# Patient Record
Sex: Female | Born: 1965 | Race: White | Hispanic: No | Marital: Married | Smoking: Never smoker
Health system: Southern US, Community
[De-identification: ages and names within clinical notes are randomized; demographics above are authoritative.]

## PROBLEM LIST (undated history)

## (undated) DIAGNOSIS — G43909 Migraine, unspecified, not intractable, without status migrainosus: Secondary | ICD-10-CM

## (undated) DIAGNOSIS — E7800 Pure hypercholesterolemia, unspecified: Secondary | ICD-10-CM

## (undated) DIAGNOSIS — E039 Hypothyroidism, unspecified: Secondary | ICD-10-CM

## (undated) DIAGNOSIS — F419 Anxiety disorder, unspecified: Secondary | ICD-10-CM

## (undated) HISTORY — PX: OTHER SURGICAL HISTORY: SHX169

## (undated) HISTORY — PX: ABDOMINAL HYSTERECTOMY: SHX81

---

## 1998-07-30 ENCOUNTER — Other Ambulatory Visit: Admission: RE | Admit: 1998-07-30 | Discharge: 1998-07-30 | Payer: Self-pay | Admitting: Obstetrics and Gynecology

## 1999-10-04 ENCOUNTER — Other Ambulatory Visit: Admission: RE | Admit: 1999-10-04 | Discharge: 1999-10-04 | Payer: Self-pay | Admitting: *Deleted

## 2000-08-15 ENCOUNTER — Encounter (INDEPENDENT_AMBULATORY_CARE_PROVIDER_SITE_OTHER): Payer: Self-pay | Admitting: Specialist

## 2000-08-15 ENCOUNTER — Ambulatory Visit (HOSPITAL_BASED_OUTPATIENT_CLINIC_OR_DEPARTMENT_OTHER): Admission: RE | Admit: 2000-08-15 | Discharge: 2000-08-15 | Payer: Self-pay | Admitting: General Surgery

## 2000-11-06 ENCOUNTER — Other Ambulatory Visit: Admission: RE | Admit: 2000-11-06 | Discharge: 2000-11-06 | Payer: Self-pay | Admitting: Obstetrics and Gynecology

## 2001-03-22 ENCOUNTER — Encounter (INDEPENDENT_AMBULATORY_CARE_PROVIDER_SITE_OTHER): Payer: Self-pay | Admitting: *Deleted

## 2001-03-22 ENCOUNTER — Ambulatory Visit (HOSPITAL_BASED_OUTPATIENT_CLINIC_OR_DEPARTMENT_OTHER): Admission: RE | Admit: 2001-03-22 | Discharge: 2001-03-22 | Payer: Self-pay | Admitting: General Surgery

## 2001-12-20 ENCOUNTER — Other Ambulatory Visit: Admission: RE | Admit: 2001-12-20 | Discharge: 2001-12-20 | Payer: Self-pay | Admitting: Obstetrics and Gynecology

## 2002-01-31 ENCOUNTER — Encounter: Payer: Self-pay | Admitting: Family Medicine

## 2002-01-31 ENCOUNTER — Encounter (INDEPENDENT_AMBULATORY_CARE_PROVIDER_SITE_OTHER): Payer: Self-pay | Admitting: *Deleted

## 2002-01-31 ENCOUNTER — Ambulatory Visit (HOSPITAL_COMMUNITY): Admission: RE | Admit: 2002-01-31 | Discharge: 2002-01-31 | Payer: Self-pay | Admitting: Family Medicine

## 2002-02-12 ENCOUNTER — Ambulatory Visit (HOSPITAL_COMMUNITY): Admission: RE | Admit: 2002-02-12 | Discharge: 2002-02-12 | Payer: Self-pay | Admitting: Surgery

## 2002-02-12 ENCOUNTER — Encounter: Payer: Self-pay | Admitting: Surgery

## 2002-03-21 ENCOUNTER — Encounter: Payer: Self-pay | Admitting: Surgery

## 2002-03-26 ENCOUNTER — Inpatient Hospital Stay (HOSPITAL_COMMUNITY): Admission: RE | Admit: 2002-03-26 | Discharge: 2002-03-27 | Payer: Self-pay | Admitting: Surgery

## 2002-03-26 ENCOUNTER — Encounter (INDEPENDENT_AMBULATORY_CARE_PROVIDER_SITE_OTHER): Payer: Self-pay | Admitting: *Deleted

## 2003-02-11 ENCOUNTER — Other Ambulatory Visit: Admission: RE | Admit: 2003-02-11 | Discharge: 2003-02-11 | Payer: Self-pay | Admitting: Obstetrics and Gynecology

## 2004-05-06 ENCOUNTER — Other Ambulatory Visit: Admission: RE | Admit: 2004-05-06 | Discharge: 2004-05-06 | Payer: Self-pay | Admitting: Obstetrics and Gynecology

## 2004-08-11 ENCOUNTER — Encounter (INDEPENDENT_AMBULATORY_CARE_PROVIDER_SITE_OTHER): Payer: Self-pay | Admitting: *Deleted

## 2004-08-11 ENCOUNTER — Ambulatory Visit (HOSPITAL_COMMUNITY): Admission: RE | Admit: 2004-08-11 | Discharge: 2004-08-11 | Payer: Self-pay | Admitting: Obstetrics and Gynecology

## 2004-09-08 ENCOUNTER — Other Ambulatory Visit: Admission: RE | Admit: 2004-09-08 | Discharge: 2004-09-08 | Payer: Self-pay | Admitting: Obstetrics and Gynecology

## 2005-05-24 ENCOUNTER — Observation Stay (HOSPITAL_COMMUNITY): Admission: RE | Admit: 2005-05-24 | Discharge: 2005-05-25 | Payer: Self-pay | Admitting: Obstetrics and Gynecology

## 2005-05-24 ENCOUNTER — Encounter (INDEPENDENT_AMBULATORY_CARE_PROVIDER_SITE_OTHER): Payer: Self-pay | Admitting: Specialist

## 2006-09-11 ENCOUNTER — Encounter: Admission: RE | Admit: 2006-09-11 | Discharge: 2006-09-11 | Payer: Self-pay | Admitting: Family Medicine

## 2008-07-16 ENCOUNTER — Emergency Department (HOSPITAL_COMMUNITY): Admission: EM | Admit: 2008-07-16 | Discharge: 2008-07-16 | Payer: Self-pay | Admitting: Emergency Medicine

## 2010-10-18 LAB — COMPREHENSIVE METABOLIC PANEL
ALT: 14 U/L (ref 0–35)
AST: 23 U/L (ref 0–37)
Albumin: 4.1 g/dL (ref 3.5–5.2)
Alkaline Phosphatase: 49 U/L (ref 39–117)
BUN: 15 mg/dL (ref 6–23)
CO2: 22 mEq/L (ref 19–32)
Calcium: 8.5 mg/dL (ref 8.4–10.5)
Chloride: 105 mEq/L (ref 96–112)
Creatinine, Ser: 0.92 mg/dL (ref 0.4–1.2)
GFR calc Af Amer: >60 mL/min (ref >60)
GFR calc non Af Amer: >60 mL/min (ref >60)
Glucose, Bld: 146 mg/dL — ABNORMAL HIGH (ref 70–99)
Potassium: 3 mEq/L — ABNORMAL LOW (ref 3.5–5.1)
Sodium: 138 mEq/L (ref 135–145)
Total Bilirubin: 0.9 mg/dL (ref 0.3–1.2)
Total Protein: 6.9 g/dL (ref 6.0–8.3)

## 2010-10-18 LAB — LIPASE, BLOOD: Lipase: 24 U/L (ref 11–59)

## 2010-11-04 ENCOUNTER — Other Ambulatory Visit: Payer: Self-pay | Admitting: Dermatology

## 2010-11-19 NOTE — Op Note (Signed)
Kimberly Zimmerman, BARICH             ACCOUNT NO.:  [id number]   MEDICAL RECORD NO.:  [id number]          PATIENT TYPE:  AMB   LOCATION:  SDC                           FACILITY:  [facility]   PHYSICIAN:  Duke Salvia. Marcelle Overlie, M.D.DATE OF BIRTH:  1966/06/17   DATE OF PROCEDURE:  08/11/2004  DATE OF DISCHARGE:                                 OPERATIVE REPORT   PREOPERATIVE DIAGNOSIS:  Abnormal uterine bleeding.   POSTOPERATIVE DIAGNOSIS:  Abnormal uterine bleeding.   PROCEDURE:  D&C, Novasure EMA.   SURGEON:  Duke Salvia. Marcelle Overlie, M.D.   ANESTHESIA:  Paracervical block plus general.   SPECIMENS:  Endometrial curettings.   ESTIMATED BLOOD LOSS:  Less than 5 mL.   COMPLICATIONS:  None.   DESCRIPTION OF PROCEDURE:  The patient was taken to the operating room and  after adequate level of general mask anesthesia was obtained with the  patient's legs in stirrups, the perineum and vagina were prepped and draped  with Betadine.  The bladder was drained.  EUA carried out with uterus  midposition and normal size.  Adnexa negative.  Cervix grasped with a  tenaculum.  Paracervical block was instituted primarily to help with  postoperative pain, 5 to 7 mL of 1% Nesacaine and 1% Xylocaine on either  side at 3 and 9 o'clock after negative aspiration was placed.  The uterus  was then sounded to 8.5 cm with a cervical length of 3, cavity length was  therefore 5.5.  After dilatation, D&C was carried out mainly to obtain a  specimen for endometrial biopsy.  She had previously had a sonohysterogram  which showed a normal cavity.  Minimal tissue was removed.  The Novasure was  then placed per protocol at a cavity depth of 5.5, width assessed at 2.5,  past the CO2 test and ablation was carried out per protocol.  A power of 76  with a time of 90 seconds without complications.  The instrument was  removed.  She had no bleeding and went to the recovery room in good  condition.  She did receive Toradol IV.      RMH/MEDQ  D:  08/11/2004  T:  08/11/2004  Job:  [id number]

## 2010-11-19 NOTE — Discharge Summary (Signed)
Kimberly Zimmerman, Kimberly Zimmerman             ACCOUNT NO.:  [id number]   MEDICAL RECORD NO.:  [id number]          PATIENT TYPE:  OBV   LOCATION:  9302                          [facility]ACILITY:  [facility]   PHYSICIAN:  Duke Salvia. Kimberly Zimmerman, Kimberly ZimmermanDATE OF BIRTH:  July 04, 1966   DATE OF ADMISSION:  05/24/2005  DATE OF DISCHARGE:  05/25/2005                                 DISCHARGE SUMMARY   DISCHARGE DIAGNOSES:  1.  Menorrhagia.  2.  Laparoscopically assisted vaginal hysterectomy this admission.   HISTORY OF PRESENT ILLNESS:  For summary of the admission history and  physical examination please see admission H and P for details.  Briefly,  this is a 45 year old G4, P2 whose husband has had a vasectomy.  Earlier  this year she underwent a endometrial ablation but has continued to have  bothersome bleeding and presents now for definitive hysterectomy.   HOSPITAL COURSE:  On May 24, 2005 under general anesthesia the patient  underwent a laparoscopically assisted vaginal hysterectomy.  The first  postoperative day she was afebrile, ambulating without difficulty.  She had  established normal bowel and urinary function.  CBC is pending at the time  of discharge.  Her abdominal examination is unremarkable and she was ready  for discharge at that point.   LABORATORY DATA:  Blood type is A positive, antibody screen negative.  Urinalysis was negative.  Hemoglobin on admission 13.4. The remainder of her  CBC was normal.  UPT negative.   DISPOSITION:  Patient is discharged on Mepergan Fortis one p.o. q.4-6h. PRN  pain or Motrin 800 mg p.o. q.8h. PRN pain.  She will return to the office in  one week.  She is advised to report any incisional redness or drainage,  increased pain or bleeding or fever over 101.  Other symptoms to report  would include urinary complaints, persistent nausea or vomiting.  She was  given specific instructions regarding diet, sex, exercise.   CONDITION ON DISCHARGE:  Good.   ACTIVITY:   Graded increased.      Kimberly Zimmerman, M.D.  Electronically Signed     RMH/MEDQ  D:  05/25/2005  T:  05/25/2005  Job:  [id number]

## 2010-11-19 NOTE — H&P (Signed)
Kimberly Zimmerman, Kimberly Zimmerman             ACCOUNT NO.:  [id number]   MEDICAL RECORD NO.:  [id number]          PATIENT TYPE:  AMB   LOCATION:  SDC                           [facility]   PHYSICIAN:  Duke Salvia. Marcelle Overlie, M.D.DATE OF BIRTH:  10/16/1965   DATE OF ADMISSION:  05/24/2005  DATE OF DISCHARGE:                                HISTORY & PHYSICAL   CHIEF COMPLAINT:  Abnormal bleeding and dysmenorrhea.   HISTORY OF PRESENT ILLNESS:  A 45 year old, G4, P2, whose husband has had a  vasectomy.  Recently because of bleeding and cramping, she had undergone HSG  in 2003 that showed a normal cavity with no other abnormalities.   She underwent D&C with NovaSure EMA August 31, 2004.  Pathology then  showed endometrial polyp with some complex hyperplasia without atypia.  She  has had continued problems since the ablation and presents now for  definitive hysterectomy.  This procedure including risk of bleeding,  infection, adjacent organ injury, risks related to transfusion along with  her expected recovery time all reviewed, all of which she understands and  accepts.   PAST MEDICAL HISTORY:   ALLERGIES:  1.  ERYTHROMYCIN.  2.  SEPTRA.  3.  CODEINE.   CURRENT MEDICATIONS:  Levothroid.   OBSTETRICAL HISTORY:  She has had 2 vaginal deliveries at term without  complication.   FAMILY HISTORY:  Significant for a father with skin cancer, grandfather with  lung cancer and grandmother also with lung cancer.  She is a nonsmoker.   PHYSICAL EXAMINATION:  VITAL SIGNS:  Temp 98.2, blood pressure 128/62.  HEENT:  Unremarkable.  NECK:  Supple without masses.  LUNGS:  Clear.  CARDIOVASCULAR:  Regular rate and rhythm without murmurs, rubs, or gallops.  BREASTS:  Without masses.  ABDOMEN:  Soft, flat, nontender.  PELVIC EXAM:  Normal external genitalia, vagina, cervix, uterus midposition,  normal size, adnexa negative.  EXTREMITIES/NEUROLOGIC:  Unremarkable.   IMPRESSION:  Continued cramping  and bleeding post ablation, probable  adenomyosis.   PLAN:  LAVH.  The procedure and risks were reviewed as above.      Richard M. Marcelle Overlie, M.D.  Electronically Signed     RMH/MEDQ  D:  05/18/2005  T:  05/18/2005  Job:  [id number]

## 2010-11-19 NOTE — Op Note (Signed)
[place]  Patient:    SHARLETTE, JANSMA                      MRN: [id number] Proc. Date: 08/15/00 Adm. Date:  [id number] Attending:  Janalyn Rouse                           Operative Report  PREOPERATIVE DIAGNOSIS:  Hypertrophic breast tissue in the tail of Spence.  POSTOPERATIVE DIAGNOSIS:  Hypertrophic breast tissue in the tail of Spence.  OPERATION:  Excision of same.  SURGEON:  Rose Phi. Maple Hudson, M.D.  ANESTHESIA:  General.  DESCRIPTION OF PROCEDURE:  The hypertrophic and tender breast tissue that extended over the lateral margin of the pectoralis major muscle on the right side was outlined with a marking pencil.  After suitable general anesthesia was induced, a curved incision was made overlying it and then, using the cautery, I excised this area of tissue, which just looked like fibrotic breast tissue.  Hemostasis obtained with the cautery.  Subcuticular closure of 4-0 Monocryl and Steri-Strips carried out.  Dressing applied.  Patient transferred to recovery room in satisfactory condition having tolerated the procedure well. DD:  08/15/00 TD:  08/15/00 Job: [id number]

## 2010-11-19 NOTE — Op Note (Signed)
[place]  Patient:    Kimberly Zimmerman, Kimberly Zimmerman Visit Number: [id number] MRN: [id number]          Service Type: DSU Location: [facility] Attending Physician:  Janalyn Rouse Dictated by:   Rose Phi. Maple Hudson, M.D. Proc. Date: 03/22/01 Admit Date:  03/22/2001                             Operative Report  PREOPERATIVE DIAGNOSIS:  Prominent breast tissue in the axilla with pain and tenderness.  POSTOPERATIVE DIAGNOSIS:  Prominent breast tissue in the axilla with pain and tenderness.  OPERATION PERFORMED:  Excision of right breast mass.  SURGEON:  Rose Phi. Maple Hudson, M.D.  ANESTHESIA:  General.  DESCRIPTION OF PROCEDURE:  The patient was placed on the operating table with the right arm extended on the arm board and the axilla prepped and draped in the usual fashion.  Her previous axillary incision was then incised after infiltrating it with 0.25% Marcaine.  I then excised all of this axillary fat and breast tissue.  One bleeder had to be ligated.  We had good hemostasis.  I then injected the 0.25% Marcaine and closed the incision with 3-0 Vicryl and a subcuticular 4-0 Monocryl and Steri-Strips.  Dressing applied.  The patient was transferred to the recovery room in satisfactory condition having tolerated the procedure well. Dictated by:   Rose Phi. Maple Hudson, M.D. Attending Physician:  Janalyn Rouse DD:  03/22/01 TD:  03/22/01 Job: [id number]

## 2010-11-19 NOTE — H&P (Signed)
Kimberly Zimmerman, Kimberly Zimmerman             ACCOUNT NO.:  [id number]   MEDICAL RECORD NO.:  [id number]          PATIENT TYPE:  AMB   LOCATION:  SDC                           [facility]   PHYSICIAN:  Duke Salvia. Marcelle Overlie, M.D.DATE OF BIRTH:  [birth year]   DATE OF ADMISSION:  DATE OF DISCHARGE:                                HISTORY & PHYSICAL   DATE OF SCHEDULED SURGERY:  August 11, 2004   CHIEF COMPLAINT:  Menorrhagia.   HISTORY OF PRESENT ILLNESS:  A 45 year old G4 P2.  This patient's husband  has had a vasectomy.  Her children are ages 22 and 79.  Due to persistent  problems with menorrhagia she underwent SHG in our office September 2003  that showed a normal endometrial cavity, no other evidence of polyps or  fibroids.  She has failed control with OCPs and presents now for endometrial  ablation.  This procedure including risk of bleeding, infection, other  complications such as perforation that may require additional follow-up or  surgery discussed with her which she understands and accepts.  The  amenorrhea/hypomenorrhea rates for NovaSure endometrial ablation reviewed  with her also.   PAST MEDICAL HISTORY:  Allergies:  SEPTRA, ERYTHROMYCIN, and CODEINE.   OBSTETRICAL HISTORY:  Two vaginal deliveries at term.  She has had two other  SABs.   FAMILY HISTORY:  Significant for her father with skin cancer and mother with  thyroid dysfunction; otherwise, unremarkable.   CURRENT MEDICATIONS:  Levothroid.   PHYSICAL EXAMINATION:  VITAL SIGNS:  Temperature 98.2, blood pressure  104/78.  HEENT:  Unremarkable.  NECK:  Supple without masses.  LUNGS:  Clear.  CARDIOVASCULAR:  Regular rate and rhythm without murmurs, rubs, gallops  noted.  BREASTS:  Without masses.  ABDOMEN:  Soft, flat, nontender.  PELVIC:  Normal external genitalia, vagina and cervix clear.  Uterus was mid  position, normal size.  Adnexa negative.  EXTREMITIES AND NEUROLOGIC:  Unremarkable.   IMPRESSION:   Menorrhagia.   PLAN:  D&C, NovaSure endometrial ablation.  Procedure and risks reviewed as  above.      RMH/MEDQ  D:  08/03/2004  T:  08/03/2004  Job:  [id number]

## 2010-11-19 NOTE — Op Note (Signed)
Kimberly Zimmerman, Kimberly Zimmerman             ACCOUNT NO.:  [id number]   MEDICAL RECORD NO.:  [id number]          PATIENT TYPE:  AMB   LOCATION:  SDC                           [facility]   PHYSICIAN:  Duke Salvia. Marcelle Overlie, M.D.DATE OF BIRTH:  Jan 20, 1966   DATE OF PROCEDURE:  05/24/2005  DATE OF DISCHARGE:                                 OPERATIVE REPORT   PREOPERATIVE DIAGNOSES:  1.  Menorrhagia.  2.  Possible adenomyosis.   POSTOPERATIVE DIAGNOSES:  1.  Menorrhagia.  2.  Possible adenomyosis.   PROCEDURE:  Laparoscopically-assisted vaginal hysterectomy.   SURGEON:  Duke Salvia. Marcelle Overlie, M.D.   ANESTHESIA:  General endotracheal.   ASSISTANT:  Zelphia Cairo, M.D.   SPECIMENS REMOVED:  Uterus.   ESTIMATED BLOOD LOSS:  200 mL.   PROCEDURE AND FINDINGS:  The patient was taken to the operating room.  After  an adequate level of general endotracheal anesthesia was obtained with the  patient's legs in stirrups, the abdomen, perineum and vagina were prepped  and draped in the usual manner for LAVH.  The bladder was drained, EUA  carried out.  Uterus midposition, normal size, mobile.  Adnexa negative.  Hulka tenaculum was positioned, attention directed to the a subumbilical  area, which was infiltrated with 0.5% Marcaine plain.  A small incision was  made and the Veress needle was introduced at difficulty.  Its intra-  abdominal position was verified by pressure and water testing.  After a 2.5  L pneumoperitoneum was then created, laparoscopic trocar sleeve were then  introduced without difficulty.  Three fingerbreadths above the symphysis in  the midline, a 5 mm trocar was inserted under direct visualization.  The  patient then placed in Trendelenburg and the pelvic findings as follows.   The uterus itself was somewhat globular at the fundus, upper limit of normal  size, mobile, right adnexa unremarkable.  The cul-de-sac and anterior space  were free and clear.  There was some filmy  adhesions surrounding a 10% of  the left ovary, which were lysed in an avascular plane, allowing it to be  elevated.  One pigmented area on the surface of the ovary that was pretty  minimal that may have represented endometriosis was coagulated with bipolar  later.  Otherwise, that ovary was normal and was conserved.  The utero-  ovarian pedicle on each side was then coagulated and divided with the Gyrus  PK instrument down to and including the round ligament with excellent  hemostasis.  The vaginal portion of the procedure started at that point.  The legs were extended, a weighted speculum was positioned, the cervix  grasped with a tenaculum.  The cervicovaginal mucosa was incised  circumferentially, posterior colpotomy performed without difficulty.  The  left uterosacral ligament was then coagulated and divided with the Gyrus  handheld PK instrument.  The bladder was then advanced superiorly until the  peritoneum could be identified.  The peritoneum was entered sharply and a  retractor used to gently elevate the bladder out of the field.  In a  sequential manner, the cardinal ligament, uterine vasculature pedicle and  upper  broad ligaments were clamped, coagulated and divided.  The fundus of  the uterus was then delivered posteriorly. The remaining pedicle was  clamped, divided and free tied with 0 Vicryl free tie after the specimen was  removed.  The cuff was then closed from 3 to 9 o'clock with a running locked  2-0 Vicryl suture.  A McCall culdoplasty suture placed from left uterosacral  ligament, picking up the posterior peritoneum across to the opposite  uterosacral ligament, which was tied down for extra posterior support.  Prior to closure sponge, needle and instrument counts were reported as  correct x2.  The mucosa was then closed right-to-left with interrupted 2-0  Monocryl sutures.  Foley catheter positioned draining clear urine.  Attention directed to repeat laparoscopy.  The  pelvis was irrigated and  aspirated within the Nezhat irrigator.  The operative site was noted to be  hemostatic.  Pressure was reduced with excellent hemostasis.  Instruments  were removed and gas allowed to escape.  The defect was closed with 4-0  Dexon subcuticular sutures and Dermabond.  She tolerated this well, went to  the recovery room in good condition.      Richard M. Marcelle Overlie, M.D.  Electronically Signed     RMH/MEDQ  D:  05/24/2005  T:  05/24/2005  Job:  [id number]

## 2010-11-19 NOTE — Op Note (Signed)
NAMEDAVEIGH, Kimberly Zimmerman                         ACCOUNT NO.:  [id number]   MEDICAL RECORD NO.:  [id number]                   PATIENT TYPE:  INP   LOCATION:  5725                                 FACILITY:  [facility]   PHYSICIAN:  Douglas A. Magnus Ivan, M.D.           DATE OF BIRTH:  28-Aug-1965   DATE OF PROCEDURE:  03/26/2002  DATE OF DISCHARGE:  03/27/2002                                 OPERATIVE REPORT   PREOPERATIVE DIAGNOSES:  Multinodular thyroid goiter.   POSTOPERATIVE DIAGNOSES:  Multinodular thyroid goiter.   OPERATION PERFORMED:  Total thyroidectomy.   SURGEON:  Douglas A. Magnus Ivan, M.D.   ASSISTANT:  Rose Phi. Maple Hudson, M.D.   ANESTHESIA:  General endotracheal.   ESTIMATED BLOOD LOSS:  Minimal.   INDICATIONS FOR PROCEDURE:  The patient is a 45 year old female who presents  with a multinodular goiter with ______ left side.  Fine needle aspiration  has confirmed this to be consistent with a thyroid goiter.  She has been  having compressive symptoms on her trachea and the enlarged thyroid is  clearly visible.  She has a family history of her mother and grandmother  needing thyroidectomies for goiter.   DESCRIPTION OF PROCEDURE:  The patient was brought to the operating room and  identified.  She was placed supine on the operating table and general  anesthesia was induced.  Her neck was then prepped and draped in the usual  sterile fashion.  Using a 10 blade a transverse incision was made across the  lower neck in a skin fold.  The incision was then carried down through the  platysma with the electrocautery.  Next, superior and inferior skin flaps  were created with the cautery.  The median raphe was then opened along the  midline with the electrocautery.  The strap muscles were then identified and  dissected free of the top of the thyroid gland.  The muscles were retracted  and not split.  Examination of the left side of the gland revealed a diffuse  thyroid goiter with a very  large lower lobe nodule that appeared free  floating.  Dissection was then carried out staying close to the capsule and  thyroid gland.  The middle vein was identified and clipped proximally and  distally and cut.  Dissection further with Kitner bluntly allowed the gland  to be elevated out of the incision.  The vessels to the upper pole were  easily identified, clipped several times proximally and once distally and  transected.  The superior parathyroid gland was also identified and spared.  Further dissection lower revealed the extremely large lower nodule that  appeared to go into a substernal location.  This was again easily elevated  bluntly and dissected out with both right angle clamp and the  electrocautery.  The recurrent laryngeal nerve on the left side was also  easily identified as well as the inferior parathyroid gland.  Dissection was  continued  and branches off the inferior thyroid artery were identified,  clipped and cut as well.  The gland was then dissected up to the midline.  The separate substernal nodule was completely freed and excised.  This was  sent to pathology.  Next our attention was turned toward the right side of  the gland.  Again the overlying strap muscles were dissected off the gland  with the electrocautery and then blunt dissection was performed with the  Kittner around the lateral aspect of the gland.  Again, the medial vein was  identified and clipped twice proximally and once distally and transected.  Again the superior pole appeared easy to dissect.  Therefore these vessels  were identified and clipped proximally and distally and transected.  Again  the superior parathyroid artery appeared to be identified on the right side  and was spared.  Further dissection revealed large nodules again on the  lower pole of the right gland.  These were again dissected free with the  electrocautery.  Again the recurrent laryngeal nerve was identified on the  right  side as well and spared.  Branches to the inferior thyroid artery on  the right side were also identified and clipped.  The thyroid was then  elevated further and its attachments to the Berry ligaments were taken down  with the electrocautery.  The entire gland was then completely excised and  sent to pathology.  Both sides of the trachea were thoroughly irrigated with  normal saline.  Hemostasis appeared to be achieved.  Again the nerves and  parathyroids were examined and felt to be intact.  A piece of Surgicel was  then placed on each side.  The median raphe was then closed with a running 3-  0 Vicryl suture.  The platysma was then closed with interrupted 4-0 Vicryl  sutures and the skin was closed with running 4-0 Monocryl.  Steri-Strips,  gauze and tape were then applied.  The patient tolerated the procedure well.  All sponge, needle and instrument counts were correct at the end of the  procedure.  The patient was then extubated in the operating room and taken  in stable condition to the recovery room.                                                  Douglas A. Magnus Ivan, M.D.    DAB/MEDQ  D:  03/26/2002  T:  03/26/2002  Job:  [id number]

## 2011-09-06 ENCOUNTER — Other Ambulatory Visit: Payer: Self-pay | Admitting: Obstetrics and Gynecology

## 2011-09-06 DIAGNOSIS — N6312 Unspecified lump in the right breast, upper inner quadrant: Secondary | ICD-10-CM

## 2011-09-14 ENCOUNTER — Ambulatory Visit
Admission: RE | Admit: 2011-09-14 | Discharge: 2011-09-14 | Disposition: A | Discharge status: Home / Self Care | Payer: 59 | Source: Ambulatory Visit | Attending: Obstetrics and Gynecology | Admitting: Obstetrics and Gynecology

## 2011-09-14 DIAGNOSIS — N6312 Unspecified lump in the right breast, upper inner quadrant: Secondary | ICD-10-CM

## 2012-02-24 ENCOUNTER — Other Ambulatory Visit: Payer: Self-pay | Admitting: Obstetrics and Gynecology

## 2012-02-24 DIAGNOSIS — Z1231 Encounter for screening mammogram for malignant neoplasm of breast: Secondary | ICD-10-CM

## 2012-03-15 ENCOUNTER — Ambulatory Visit
Admission: RE | Admit: 2012-03-15 | Discharge: 2012-03-15 | Disposition: A | Discharge status: Home / Self Care | Payer: 59 | Source: Ambulatory Visit | Attending: Obstetrics and Gynecology | Admitting: Obstetrics and Gynecology

## 2012-03-15 DIAGNOSIS — Z1231 Encounter for screening mammogram for malignant neoplasm of breast: Secondary | ICD-10-CM

## 2013-03-01 ENCOUNTER — Other Ambulatory Visit: Payer: Self-pay | Admitting: Obstetrics and Gynecology

## 2013-03-01 DIAGNOSIS — Z1231 Encounter for screening mammogram for malignant neoplasm of breast: Secondary | ICD-10-CM

## 2013-03-01 DIAGNOSIS — Z9889 Other specified postprocedural states: Secondary | ICD-10-CM

## 2013-03-20 ENCOUNTER — Ambulatory Visit
Admission: RE | Admit: 2013-03-20 | Discharge: 2013-03-20 | Disposition: A | Discharge status: Home / Self Care | Payer: 59 | Source: Ambulatory Visit | Attending: Obstetrics and Gynecology | Admitting: Obstetrics and Gynecology

## 2013-03-20 DIAGNOSIS — Z1231 Encounter for screening mammogram for malignant neoplasm of breast: Secondary | ICD-10-CM

## 2013-03-20 DIAGNOSIS — Z9889 Other specified postprocedural states: Secondary | ICD-10-CM

## 2014-03-26 ENCOUNTER — Other Ambulatory Visit: Payer: Self-pay

## 2014-03-26 DIAGNOSIS — Z1231 Encounter for screening mammogram for malignant neoplasm of breast: Secondary | ICD-10-CM

## 2014-04-04 ENCOUNTER — Ambulatory Visit
Admission: RE | Admit: 2014-04-04 | Discharge: 2014-04-04 | Disposition: A | Discharge status: Home / Self Care | Payer: 59 | Source: Ambulatory Visit

## 2014-04-04 DIAGNOSIS — Z1231 Encounter for screening mammogram for malignant neoplasm of breast: Secondary | ICD-10-CM

## 2014-04-07 ENCOUNTER — Other Ambulatory Visit: Payer: Self-pay | Admitting: Obstetrics and Gynecology

## 2014-04-07 DIAGNOSIS — R928 Other abnormal and inconclusive findings on diagnostic imaging of breast: Secondary | ICD-10-CM

## 2014-04-10 ENCOUNTER — Ambulatory Visit
Admission: RE | Admit: 2014-04-10 | Discharge: 2014-04-10 | Disposition: A | Discharge status: Home / Self Care | Payer: 59 | Source: Ambulatory Visit | Attending: Obstetrics and Gynecology | Admitting: Obstetrics and Gynecology

## 2014-04-10 ENCOUNTER — Other Ambulatory Visit: Payer: Self-pay | Admitting: Neurosurgery

## 2014-04-10 DIAGNOSIS — R928 Other abnormal and inconclusive findings on diagnostic imaging of breast: Secondary | ICD-10-CM

## 2014-04-10 DIAGNOSIS — M5416 Radiculopathy, lumbar region: Secondary | ICD-10-CM

## 2014-04-21 ENCOUNTER — Ambulatory Visit
Admission: RE | Admit: 2014-04-21 | Discharge: 2014-04-21 | Disposition: A | Discharge status: Home / Self Care | Payer: 59 | Source: Ambulatory Visit | Attending: Neurosurgery | Admitting: Neurosurgery

## 2014-04-21 DIAGNOSIS — M5416 Radiculopathy, lumbar region: Secondary | ICD-10-CM

## 2014-07-08 ENCOUNTER — Other Ambulatory Visit: Payer: Self-pay | Admitting: Obstetrics and Gynecology

## 2014-07-09 LAB — CYTOLOGY - PAP

## 2014-09-09 ENCOUNTER — Other Ambulatory Visit: Payer: Self-pay | Admitting: Obstetrics and Gynecology

## 2014-09-09 DIAGNOSIS — N6310 Unspecified lump in the right breast, unspecified quadrant: Secondary | ICD-10-CM

## 2014-09-19 ENCOUNTER — Other Ambulatory Visit: Payer: Self-pay | Admitting: Family Medicine

## 2014-09-19 ENCOUNTER — Ambulatory Visit
Admission: RE | Admit: 2014-09-19 | Discharge: 2014-09-19 | Disposition: A | Discharge status: Home / Self Care | Payer: Self-pay | Source: Ambulatory Visit | Attending: Family Medicine | Admitting: Family Medicine

## 2014-09-19 DIAGNOSIS — R059 Cough, unspecified: Secondary | ICD-10-CM

## 2014-09-19 DIAGNOSIS — R05 Cough: Secondary | ICD-10-CM

## 2014-10-06 ENCOUNTER — Ambulatory Visit
Admission: RE | Admit: 2014-10-06 | Discharge: 2014-10-06 | Disposition: A | Discharge status: Home / Self Care | Payer: 59 | Source: Ambulatory Visit | Attending: Obstetrics and Gynecology | Admitting: Obstetrics and Gynecology

## 2014-10-06 DIAGNOSIS — N6310 Unspecified lump in the right breast, unspecified quadrant: Secondary | ICD-10-CM

## 2015-03-10 ENCOUNTER — Other Ambulatory Visit: Payer: Self-pay | Admitting: Obstetrics and Gynecology

## 2015-03-10 DIAGNOSIS — N6310 Unspecified lump in the right breast, unspecified quadrant: Secondary | ICD-10-CM

## 2015-04-06 ENCOUNTER — Ambulatory Visit
Admission: RE | Admit: 2015-04-06 | Discharge: 2015-04-06 | Disposition: A | Discharge status: Home / Self Care | Payer: 59 | Source: Ambulatory Visit | Attending: Obstetrics and Gynecology | Admitting: Obstetrics and Gynecology

## 2015-04-06 ENCOUNTER — Other Ambulatory Visit: Payer: Self-pay | Admitting: Obstetrics and Gynecology

## 2015-04-06 DIAGNOSIS — N6320 Unspecified lump in the left breast, unspecified quadrant: Secondary | ICD-10-CM

## 2015-04-06 DIAGNOSIS — N6310 Unspecified lump in the right breast, unspecified quadrant: Secondary | ICD-10-CM

## 2015-07-21 ENCOUNTER — Ambulatory Visit (INDEPENDENT_AMBULATORY_CARE_PROVIDER_SITE_OTHER): Payer: 59 | Admitting: Podiatry

## 2015-07-21 ENCOUNTER — Encounter: Payer: Self-pay | Admitting: Podiatry

## 2015-07-21 ENCOUNTER — Ambulatory Visit (INDEPENDENT_AMBULATORY_CARE_PROVIDER_SITE_OTHER): Payer: 59

## 2015-07-21 ENCOUNTER — Telehealth: Payer: Self-pay | Admitting: *Deleted

## 2015-07-21 DIAGNOSIS — M2011 Hallux valgus (acquired), right foot: Secondary | ICD-10-CM | POA: Diagnosis not present

## 2015-07-21 NOTE — Patient Instructions (Signed)
Pre-Operative Instructions  Congratulations, you have decided to take an important step to improving your quality of life.  You can be assured that the doctors of [facility] will be with you every step of the way.  1. Plan to be at the surgery center/hospital at least 1 (one) hour prior to your scheduled time unless otherwise directed by the surgical center/hospital staff.  You must have a responsible adult accompany you, remain during the surgery and drive you home.  Make sure you have directions to the surgical center/hospital and know how to get there on time. 2. For hospital based surgery you will need to obtain a history and physical form from your family physician within 1 month prior to the date of surgery- we will give you a form for you primary physician.  3. We make every effort to accommodate the date you request for surgery.  There are however, times where surgery dates or times have to be moved.  We will contact you as soon as possible if a change in schedule is required.   4. No Aspirin/Ibuprofen for one week before surgery.  If you are on aspirin, any non-steroidal anti-inflammatory medications (Mobic, Aleve, Ibuprofen) you should stop taking it 7 days prior to your surgery.  You make take Tylenol  For pain prior to surgery.  5. Medications- If you are taking daily heart and blood pressure medications, seizure, reflux, allergy, asthma, anxiety, pain or diabetes medications, make sure the surgery center/hospital is aware before the day of surgery so they may notify you which medications to take or avoid the day of surgery. 6. No food or drink after midnight the night before surgery unless directed otherwise by surgical center/hospital staff. 7. No alcoholic beverages 24 hours prior to surgery.  No smoking 24 hours prior to or 24 hours after surgery. 8. Wear loose pants or shorts- loose enough to fit over bandages, boots, and casts. 9. No slip on shoes, sneakers are best. 10. Bring  your boot with you to the surgery center/hospital.  Also bring crutches or a walker if your physician has prescribed it for you.  If you do not have this equipment, it will be provided for you after surgery. 11. If you have not been contracted by the surgery center/hospital by the day before your surgery, call to confirm the date and time of your surgery. 12. Leave-time from work may vary depending on the type of surgery you have.  Appropriate arrangements should be made prior to surgery with your employer. 13. Prescriptions will be provided immediately following surgery by your doctor.  Have these filled as soon as possible after surgery and take the medication as directed. 14. Remove nail polish on the operative foot. 15. Wash the night before surgery.  The night before surgery wash the foot and leg well with the antibacterial soap provided and water paying special attention to beneath the toenails and in between the toes.  Rinse thoroughly with water and dry well with a towel.  Perform this wash unless told not to do so by your physician.  Enclosed: 1 Ice pack (please put in freezer the night before surgery)   1 Hibiclens skin cleaner   Pre-op Instructions  If you have any questions regarding the instructions, do not hesitate to call our office.  [place]: [address] [place], [place] [address] [phone]  [place]: [address] [place], [place] [address] [phone]  [place]: [address]  [place], [place] [address] [phone]  Dr. Richard   Tuchman DPM, Dr. Norman Regal DPM Dr. Richard Sikora DPM, Dr. M. Todd Hyatt DPM, Dr. Kathryn Egerton DPM 

## 2015-07-21 NOTE — Progress Notes (Signed)
   Subjective:    Patient ID: Kimberly Zimmerman, female    DOB: 27-May-1966, 50 y.o.   MRN: [id number]  HPI: She presents today with a chief complaint of a painful bunion deformity of the right foot times many years she states it has become most painful over the past 3 months particularly with certain shoe gear. She states she's tried different shoe gear mesh tried nothing to alleviate the symptoms. She is likely continue to be athletic and active however this is limiting her activity level.    Review of Systems  Skin: Positive for color change.       Objective:   Physical Exam: 50 year old female vital signs stable alert and oriented 3. Pulses are strongly palpable. Neurologic sensorium is intact per Semmes-Weinstein monofilament. Deep tendon reflexes are intact bilateral muscle strength +5 over 5 dorsiflexion plantar flexors and inverters and everters all intrinsic musculature is intact. Orthopedic evaluation of his drains all joints distal to the angle full range of motion without crepitation. Cutaneous evaluation demonstrates supple well-hydrated cutis no erythema edema cellulitis drainage or odor. Hallux abductovalgus deformity is noted on orthopedic evaluation with limitation on range of motion of the first metatarsophalangeal joint right foot. Mild erythema overlying the first metatarsophalangeal joint and hypertrophic medial condyle with painful palpation of the medial dorsal cutaneous nerve right foot. Radiographs taken today 3 views right foot do demonstrate an increase in the first intermetatarsal angle and the normal value consistent with hallux abductovalgus deformity.        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  Plan: We discussed the etiology pathology conservative versus surgical therapies. At this point we recommended hallux abductovalgus repair consisting of an [facility] bunion repair with screw fixation right foot. We went over the  consent form today line by line number by number giving her ample time to ask questions she saw fit regarding this procedure. I answered them to the best of my ability in layman's terms. She understood this was amenable to an outside occupation the consent form and I will follow-up with her in the near future. We did discuss the possible complications which may include but are not limited to postop pain bleeding swelling infection recurrence need for further surgery inability to heal loss of digit loss of limb loss of life. Discussed the possibility of blood clots and pulmonary emboli. She was dispensed a cam walker today and I will follow-up with her in the near future.

## 2015-07-22 NOTE — Telephone Encounter (Signed)
"  I left you a message.  I also spoke to [place] this morning.  She transferred me to you.  She just wanted me to let you know I am canceling surgery for right now.  I will schedule it at a later date.  Thank you."  I'm returning your call.  "I spoke to [place] and I decided I'm going to hold off on surgery right now.  I need to work some things out with my job and other things.  I will call you later to schedule.  I'll return the boot."  Give [place] a call when you would like to schedule it.

## 2015-07-22 NOTE — Telephone Encounter (Signed)
Ok

## 2015-07-22 NOTE — Telephone Encounter (Signed)
"  I was just in a little bit ago for a consultation for surgery.  I just want check and see if I decide to wait awhile if I should bring the boot back so I won't be charged for it right now.  I need to discuss it with family.  It may be a while before I have it."

## 2015-08-24 ENCOUNTER — Other Ambulatory Visit: Payer: Self-pay | Admitting: Obstetrics and Gynecology

## 2015-08-24 DIAGNOSIS — N6489 Other specified disorders of breast: Secondary | ICD-10-CM

## 2015-10-05 ENCOUNTER — Ambulatory Visit
Admission: RE | Admit: 2015-10-05 | Discharge: 2015-10-05 | Disposition: A | Discharge status: Home / Self Care | Payer: 59 | Source: Ambulatory Visit | Attending: Obstetrics and Gynecology | Admitting: Obstetrics and Gynecology

## 2015-10-05 DIAGNOSIS — N6489 Other specified disorders of breast: Secondary | ICD-10-CM

## 2016-03-03 ENCOUNTER — Other Ambulatory Visit: Payer: Self-pay | Admitting: Obstetrics and Gynecology

## 2016-03-03 DIAGNOSIS — Z1231 Encounter for screening mammogram for malignant neoplasm of breast: Secondary | ICD-10-CM

## 2016-04-08 ENCOUNTER — Ambulatory Visit
Admission: RE | Admit: 2016-04-08 | Discharge: 2016-04-08 | Disposition: A | Discharge status: Home / Self Care | Payer: 59 | Source: Ambulatory Visit | Attending: Obstetrics and Gynecology | Admitting: Obstetrics and Gynecology

## 2016-04-08 DIAGNOSIS — Z1231 Encounter for screening mammogram for malignant neoplasm of breast: Secondary | ICD-10-CM

## 2016-05-25 ENCOUNTER — Emergency Department (HOSPITAL_COMMUNITY)
Admission: EM | Admit: 2016-05-25 | Discharge: 2016-05-25 | Disposition: A | Discharge status: Home / Self Care | Payer: 59 | Attending: Emergency Medicine | Admitting: Emergency Medicine

## 2016-05-25 ENCOUNTER — Encounter (HOSPITAL_COMMUNITY): Payer: Self-pay | Admitting: Emergency Medicine

## 2016-05-25 DIAGNOSIS — Y999 Unspecified external cause status: Secondary | ICD-10-CM | POA: Diagnosis not present

## 2016-05-25 DIAGNOSIS — M545 Low back pain: Secondary | ICD-10-CM | POA: Diagnosis present

## 2016-05-25 DIAGNOSIS — Y929 Unspecified place or not applicable: Secondary | ICD-10-CM | POA: Insufficient documentation

## 2016-05-25 DIAGNOSIS — Y939 Activity, unspecified: Secondary | ICD-10-CM | POA: Insufficient documentation

## 2016-05-25 DIAGNOSIS — X500XXA Overexertion from strenuous movement or load, initial encounter: Secondary | ICD-10-CM | POA: Diagnosis not present

## 2016-05-25 DIAGNOSIS — M5442 Lumbago with sciatica, left side: Secondary | ICD-10-CM | POA: Diagnosis not present

## 2016-05-25 HISTORY — DX: Pure hypercholesterolemia, unspecified: E78.00

## 2016-05-25 HISTORY — DX: Anxiety disorder, unspecified: F41.9

## 2016-05-25 MED ORDER — HYDROMORPHONE HCL 2 MG/ML IJ SOLN
1.0000 mg | Freq: Once | INTRAMUSCULAR | Status: AC
  Administered 2016-05-25: 1 mg via INTRAMUSCULAR
  Filled 2016-05-25: qty 1

## 2016-05-25 MED ORDER — PREDNISONE 10 MG (21) PO TBPK: 10.0000 mg | ORAL_TABLET | Freq: Every day | ORAL | 0 refills | Status: DC

## 2016-05-25 MED ORDER — HYDROCODONE-ACETAMINOPHEN 5-325 MG PO TABS: 1.0000 | ORAL_TABLET | ORAL | 0 refills | Status: DC | PRN

## 2016-05-25 MED ORDER — NAPROXEN 375 MG PO TABS: 375.0000 mg | ORAL_TABLET | Freq: Two times a day (BID) | ORAL | 0 refills | Status: AC | PRN

## 2016-05-25 MED ORDER — KETOROLAC TROMETHAMINE 60 MG/2ML IM SOLN
60.0000 mg | Freq: Once | INTRAMUSCULAR | Status: AC
  Administered 2016-05-25: 60 mg via INTRAMUSCULAR
  Filled 2016-05-25: qty 2

## 2016-05-25 NOTE — ED Provider Notes (Signed)
[facility] DEPT Provider Note   CSN: [id number] Arrival date & time: 05/25/16  0536     History   Chief Complaint Chief Complaint  Patient presents with  . Back Pain    HPI Kimberly Zimmerman is a 50 y.o. female.  HPI 50 year old female with history of left-sided lumbar radiculopathy here with acute onset of left lower back pain. The patient states she bent over and lifted her backpack up 2 days ago. She experienced an immediate onset of severe left paraspinal lower back pain. The pain radiates down her left leg. Since then, the pain has been persistent and has not improved. She was prescribed Flexeril without significant improvement. The pain is made worse with any movement, particularly first thing in the mornings and then improves throughout the day. Denies any associated fevers or chills. Denies any midline pain. Denies any trauma. No lower extremity weakness or numbness. No loss of bowel or bladder function.  Past Medical History:  Diagnosis Date  . Anxiety   . Hypercholesteremia     There are no active problems to display for this patient.   Past Surgical History:  Procedure Laterality Date  . ABDOMINAL HYSTERECTOMY    . Arm surgery    . Thyroidectomy      OB History    No data available       Home Medications    Prior to Admission medications   Medication Sig Start Date End Date Taking? Authorizing Provider  albuterol (PROVENTIL HFA;VENTOLIN HFA) 108 (90 Base) MCG/ACT inhaler Inhale 1-2 puffs into the lungs every 6 (six) hours as needed for wheezing or shortness of breath.   Yes Historical Provider, MD  ALPRAZolam (XANAX) 0.25 MG tablet Take 0.25 mg by mouth 3 (three) times daily as needed for anxiety.   Yes Historical Provider, MD  atorvastatin (LIPITOR) 10 MG tablet Take 10 mg by mouth every evening.  06/24/15  Yes Historical Provider, MD  cyclobenzaprine (FLEXERIL) 10 MG tablet Take 10 mg by mouth 3 (three) times daily as needed for muscle spasms.   Yes  Historical Provider, MD  escitalopram (LEXAPRO) 10 MG tablet Take 2.5-5 mg by mouth See admin instructions. Take 1/4 tablet every morning and take 1/2 tablet every evening 06/24/15  Yes Historical Provider, MD  ibuprofen (ADVIL,MOTRIN) 200 MG tablet Take 400 mg by mouth every 6 (six) hours as needed for mild pain.   Yes Historical Provider, MD  naproxen sodium (ANAPROX) 220 MG tablet Take 220 mg by mouth 2 (two) times daily as needed (for pain).   Yes Historical Provider, MD  promethazine-dextromethorphan (PROMETHAZINE-DM) 6.25-15 MG/5ML syrup TAKE 5MLS BY MOUTH EVERY 6 HOURS AS NEEDED 07/10/15  Yes Historical Provider, MD  SYNTHROID 100 MCG tablet Take 100 mcg by mouth daily before breakfast.  05/22/15  Yes Historical Provider, MD  HYDROcodone-acetaminophen (NORCO/VICODIN) 5-325 MG tablet Take 1-2 tablets by mouth every 4 (four) hours as needed for severe pain. 05/25/16   Duffy Bruce, MD  naproxen (NAPROSYN) 375 MG tablet Take 1 tablet (375 mg total) by mouth 2 (two) times daily as needed for moderate pain. 05/25/16 06/01/16  Duffy Bruce, MD  predniSONE (STERAPRED UNI-PAK 21 TAB) 10 MG (21) TBPK tablet Take 1 tablet (10 mg total) by mouth daily. Take 6 tabs by mouth daily  for 2 days, then 5 tabs for 2 days, then 4 tabs for 2 days, then 3 tabs for 2 days, 2 tabs for 2 days, then 1 tab by mouth daily for 2 days 05/25/16  Duffy Bruce, MD    Family History No family history on file.  Social History Social History  Substance Use Topics  . Smoking status: Never Smoker  . Smokeless tobacco: Never Used  . Alcohol use Yes     Allergies   Codeine; Erythromycin; Septra [sulfamethoxazole-trimethoprim]; and Topamax [topiramate]   Review of Systems Review of Systems  Constitutional: Negative for chills and fever.  HENT: Negative for congestion, rhinorrhea and sore throat.   Eyes: Negative for visual disturbance.  Respiratory: Negative for cough, shortness of breath and wheezing.     Cardiovascular: Negative for chest pain and leg swelling.  Gastrointestinal: Negative for abdominal pain, diarrhea, nausea and vomiting.  Genitourinary: Negative for dysuria, flank pain, vaginal bleeding and vaginal discharge.  Musculoskeletal: Positive for back pain. Negative for neck pain.  Skin: Negative for rash.  Allergic/Immunologic: Negative for immunocompromised state.  Neurological: Negative for syncope and headaches.  Hematological: Does not bruise/bleed easily.  All other systems reviewed and are negative.    Physical Exam Updated Vital Signs BP 133/64 (BP Location: Left Arm)   Pulse 92   Temp 98.7 F (37.1 C) (Oral)   Resp 16   Ht 5\' 6"  (1.676 m)   Wt 196 lb (88.9 kg)   SpO2 98%   BMI 31.64 kg/m   Physical Exam  Constitutional: She is oriented to person, place, and time. She appears well-developed and well-nourished. No distress.  HENT:  Head: Normocephalic and atraumatic.  Eyes: Conjunctivae are normal.  Neck: Neck supple.  Cardiovascular: Normal rate, regular rhythm and normal heart sounds.  Exam reveals no friction rub.   No murmur heard. Pulmonary/Chest: Effort normal and breath sounds normal. No respiratory distress. She has no wheezes. She has no rales.  Abdominal: She exhibits no distension.  Musculoskeletal: She exhibits no edema.  Neurological: She is alert and oriented to person, place, and time. She exhibits normal muscle tone.  Skin: Skin is warm. Capillary refill takes less than 2 seconds.  Psychiatric: She has a normal mood and affect.  Nursing note and vitals reviewed.   Spine Exam: Inspection/Palpation: Moderate tenderness to palpation over left lower paraspinal muscles. Positive straight leg test on the left. Strength: 5/5 throughout LE bilaterally (hip flexion/extension, adduction/abduction; knee flexion/extension; foot dorsiflexion/plantarflexion, inversion/eversion; great toe inversion) Sensation: Intact to light touch in proximal and  distal LE bilaterally Reflexes: 2+ quadriceps and achilles reflexes  ED Treatments / Results  Labs (all labs ordered are listed, but only abnormal results are displayed) Labs Reviewed - No data to display  EKG  EKG Interpretation None       Radiology No results found.  Procedures Procedures (including critical care time)  Medications Ordered in ED Medications  ketorolac (TORADOL) injection 60 mg (not administered)  HYDROmorphone (DILAUDID) injection 1 mg (not administered)     Initial Impression / Assessment and Plan / ED Course  I have reviewed the triage vital signs and the nursing notes.  Pertinent labs & imaging results that were available during my care of the patient were reviewed by me and considered in my medical decision making (see chart for details).  Clinical Course     50 year old female with past medical history as above here with left lower back pain. Exam and history is consistent with likely acute lumbar radiculopathy secondary to disc herniation. Differential includes piriformis syndrome. She has no red flag symptoms. No fevers, chills, and no history of immune suppression, IV drug use, or risk factors for osteomyelitis or epidural abscess.  No recent instrumentation. No direct trauma and she has no midline tenderness to suggest bony pathology. Will treat symptomatically and discharged with strict return precautions. No evidence of cauda equina with no lower extremity weakness, numbness, loss of bowel or bladder function.  Final Clinical Impressions(s) / ED Diagnoses   Final diagnoses:  Acute left-sided low back pain with left-sided sciatica    New Prescriptions New Prescriptions   HYDROCODONE-ACETAMINOPHEN (NORCO/VICODIN) 5-325 MG TABLET    Take 1-2 tablets by mouth every 4 (four) hours as needed for severe pain.   NAPROXEN (NAPROSYN) 375 MG TABLET    Take 1 tablet (375 mg total) by mouth 2 (two) times daily as needed for moderate pain.   PREDNISONE  (STERAPRED UNI-PAK 21 TAB) 10 MG (21) TBPK TABLET    Take 1 tablet (10 mg total) by mouth daily. Take 6 tabs by mouth daily  for 2 days, then 5 tabs for 2 days, then 4 tabs for 2 days, then 3 tabs for 2 days, 2 tabs for 2 days, then 1 tab by mouth daily for 2 days     Duffy Bruce, MD 05/25/16 [id number]

## 2016-05-25 NOTE — ED Triage Notes (Signed)
Patient reports left lower back pain radiating to left leg onset 2 days ago , she stated that she lifted her heavy bag when the pain started . Pain increases with movement / changing positions .

## 2016-08-17 DIAGNOSIS — J069 Acute upper respiratory infection, unspecified: Secondary | ICD-10-CM | POA: Diagnosis not present

## 2016-09-21 DIAGNOSIS — Z01419 Encounter for gynecological examination (general) (routine) without abnormal findings: Secondary | ICD-10-CM | POA: Diagnosis not present

## 2016-10-11 DIAGNOSIS — E039 Hypothyroidism, unspecified: Secondary | ICD-10-CM | POA: Diagnosis not present

## 2016-10-11 DIAGNOSIS — Z0000 Encounter for general adult medical examination without abnormal findings: Secondary | ICD-10-CM | POA: Diagnosis not present

## 2016-10-11 DIAGNOSIS — E785 Hyperlipidemia, unspecified: Secondary | ICD-10-CM | POA: Diagnosis not present

## 2016-12-08 DIAGNOSIS — E039 Hypothyroidism, unspecified: Secondary | ICD-10-CM | POA: Diagnosis not present

## 2017-01-26 DIAGNOSIS — M791 Myalgia: Secondary | ICD-10-CM | POA: Diagnosis not present

## 2017-03-21 DIAGNOSIS — M722 Plantar fascial fibromatosis: Secondary | ICD-10-CM | POA: Diagnosis not present

## 2017-03-21 DIAGNOSIS — E039 Hypothyroidism, unspecified: Secondary | ICD-10-CM | POA: Diagnosis not present

## 2017-03-29 ENCOUNTER — Other Ambulatory Visit: Payer: Self-pay | Admitting: Obstetrics and Gynecology

## 2017-03-29 DIAGNOSIS — Z1231 Encounter for screening mammogram for malignant neoplasm of breast: Secondary | ICD-10-CM

## 2017-04-12 ENCOUNTER — Ambulatory Visit
Admission: RE | Admit: 2017-04-12 | Discharge: 2017-04-12 | Disposition: A | Discharge status: Home / Self Care | Payer: 59 | Source: Ambulatory Visit | Attending: Obstetrics and Gynecology | Admitting: Obstetrics and Gynecology

## 2017-04-12 DIAGNOSIS — Z1231 Encounter for screening mammogram for malignant neoplasm of breast: Secondary | ICD-10-CM | POA: Diagnosis not present

## 2017-06-30 DIAGNOSIS — E039 Hypothyroidism, unspecified: Secondary | ICD-10-CM | POA: Diagnosis not present

## 2017-06-30 DIAGNOSIS — H532 Diplopia: Secondary | ICD-10-CM | POA: Diagnosis not present

## 2017-07-07 DIAGNOSIS — M545 Low back pain: Secondary | ICD-10-CM | POA: Diagnosis not present

## 2017-08-25 DIAGNOSIS — C44519 Basal cell carcinoma of skin of other part of trunk: Secondary | ICD-10-CM | POA: Diagnosis not present

## 2017-09-21 DIAGNOSIS — Z1211 Encounter for screening for malignant neoplasm of colon: Secondary | ICD-10-CM | POA: Diagnosis not present

## 2017-09-27 DIAGNOSIS — Z01419 Encounter for gynecological examination (general) (routine) without abnormal findings: Secondary | ICD-10-CM | POA: Diagnosis not present

## 2018-03-21 ENCOUNTER — Other Ambulatory Visit: Payer: Self-pay | Admitting: Obstetrics and Gynecology

## 2018-03-21 DIAGNOSIS — Z1231 Encounter for screening mammogram for malignant neoplasm of breast: Secondary | ICD-10-CM

## 2018-04-10 DIAGNOSIS — C44519 Basal cell carcinoma of skin of other part of trunk: Secondary | ICD-10-CM | POA: Diagnosis not present

## 2018-04-19 ENCOUNTER — Ambulatory Visit
Admission: RE | Admit: 2018-04-19 | Discharge: 2018-04-19 | Disposition: A | Discharge status: Home / Self Care | Payer: 59 | Source: Ambulatory Visit | Attending: Obstetrics and Gynecology | Admitting: Obstetrics and Gynecology

## 2018-04-19 DIAGNOSIS — Z1231 Encounter for screening mammogram for malignant neoplasm of breast: Secondary | ICD-10-CM | POA: Diagnosis not present

## 2018-06-23 DIAGNOSIS — R05 Cough: Secondary | ICD-10-CM | POA: Diagnosis not present

## 2018-06-23 DIAGNOSIS — J029 Acute pharyngitis, unspecified: Secondary | ICD-10-CM | POA: Diagnosis not present

## 2018-06-23 DIAGNOSIS — J209 Acute bronchitis, unspecified: Secondary | ICD-10-CM | POA: Diagnosis not present

## 2018-07-17 DIAGNOSIS — G43109 Migraine with aura, not intractable, without status migrainosus: Secondary | ICD-10-CM | POA: Diagnosis not present

## 2018-09-06 DIAGNOSIS — R079 Chest pain, unspecified: Secondary | ICD-10-CM | POA: Diagnosis not present

## 2019-01-24 ENCOUNTER — Other Ambulatory Visit: Payer: Self-pay | Admitting: Family Medicine

## 2019-01-24 DIAGNOSIS — G8929 Other chronic pain: Secondary | ICD-10-CM

## 2019-01-24 DIAGNOSIS — M5416 Radiculopathy, lumbar region: Secondary | ICD-10-CM

## 2019-01-24 DIAGNOSIS — Z8739 Personal history of other diseases of the musculoskeletal system and connective tissue: Secondary | ICD-10-CM

## 2019-03-14 ENCOUNTER — Other Ambulatory Visit: Payer: Self-pay | Admitting: Family Medicine

## 2019-03-16 ENCOUNTER — Ambulatory Visit
Admission: RE | Admit: 2019-03-16 | Discharge: 2019-03-16 | Disposition: A | Discharge status: Home / Self Care | Payer: 59 | Source: Ambulatory Visit | Attending: Family Medicine | Admitting: Family Medicine

## 2019-03-16 ENCOUNTER — Other Ambulatory Visit: Payer: Self-pay

## 2019-03-16 DIAGNOSIS — Z8739 Personal history of other diseases of the musculoskeletal system and connective tissue: Secondary | ICD-10-CM

## 2019-03-16 DIAGNOSIS — M5416 Radiculopathy, lumbar region: Secondary | ICD-10-CM

## 2019-03-16 DIAGNOSIS — G8929 Other chronic pain: Secondary | ICD-10-CM

## 2019-03-22 ENCOUNTER — Other Ambulatory Visit: Payer: Self-pay | Admitting: Obstetrics and Gynecology

## 2019-03-22 DIAGNOSIS — Z1231 Encounter for screening mammogram for malignant neoplasm of breast: Secondary | ICD-10-CM

## 2019-05-07 ENCOUNTER — Ambulatory Visit
Admission: RE | Admit: 2019-05-07 | Discharge: 2019-05-07 | Disposition: A | Discharge status: Home / Self Care | Payer: 59 | Source: Ambulatory Visit | Attending: Obstetrics and Gynecology | Admitting: Obstetrics and Gynecology

## 2019-05-07 ENCOUNTER — Other Ambulatory Visit: Payer: Self-pay

## 2019-05-07 ENCOUNTER — Ambulatory Visit: Payer: 59

## 2019-05-07 DIAGNOSIS — Z1231 Encounter for screening mammogram for malignant neoplasm of breast: Secondary | ICD-10-CM

## 2019-10-24 ENCOUNTER — Ambulatory Visit: Payer: 59

## 2020-04-06 ENCOUNTER — Other Ambulatory Visit: Payer: Self-pay | Admitting: Obstetrics and Gynecology

## 2020-04-06 DIAGNOSIS — Z1231 Encounter for screening mammogram for malignant neoplasm of breast: Secondary | ICD-10-CM

## 2020-05-07 ENCOUNTER — Other Ambulatory Visit: Payer: Self-pay

## 2020-05-07 ENCOUNTER — Ambulatory Visit
Admission: RE | Admit: 2020-05-07 | Discharge: 2020-05-07 | Disposition: A | Discharge status: Home / Self Care | Payer: 59 | Source: Ambulatory Visit | Attending: Obstetrics and Gynecology | Admitting: Obstetrics and Gynecology

## 2020-05-07 DIAGNOSIS — Z1231 Encounter for screening mammogram for malignant neoplasm of breast: Secondary | ICD-10-CM

## 2020-05-11 ENCOUNTER — Other Ambulatory Visit: Payer: Self-pay | Admitting: Obstetrics and Gynecology

## 2020-05-11 DIAGNOSIS — R928 Other abnormal and inconclusive findings on diagnostic imaging of breast: Secondary | ICD-10-CM

## 2020-05-16 ENCOUNTER — Ambulatory Visit
Admission: RE | Admit: 2020-05-16 | Discharge: 2020-05-16 | Disposition: A | Discharge status: Home / Self Care | Payer: 59 | Source: Ambulatory Visit | Attending: Obstetrics and Gynecology | Admitting: Obstetrics and Gynecology

## 2020-05-16 ENCOUNTER — Other Ambulatory Visit: Payer: Self-pay

## 2020-05-16 DIAGNOSIS — R928 Other abnormal and inconclusive findings on diagnostic imaging of breast: Secondary | ICD-10-CM

## 2020-10-02 ENCOUNTER — Other Ambulatory Visit: Payer: Self-pay | Admitting: Otolaryngology

## 2020-10-02 DIAGNOSIS — H9312 Tinnitus, left ear: Secondary | ICD-10-CM

## 2021-05-20 IMAGING — MG MM DIGITAL DIAGNOSTIC UNILAT*R* W/ TOMO W/ CAD
4 series · 4 of 12 positions shown · non-contrast
Comparison: Previous exams.

CLINICAL DATA: Screening recall for possible right breast
asymmetry.

EXAM:
DIGITAL DIAGNOSTIC UNILATERAL RIGHT MAMMOGRAM WITH TOMO AND CAD;
ULTRASOUND RIGHT BREAST LIMITED

[R ML synth-2D]
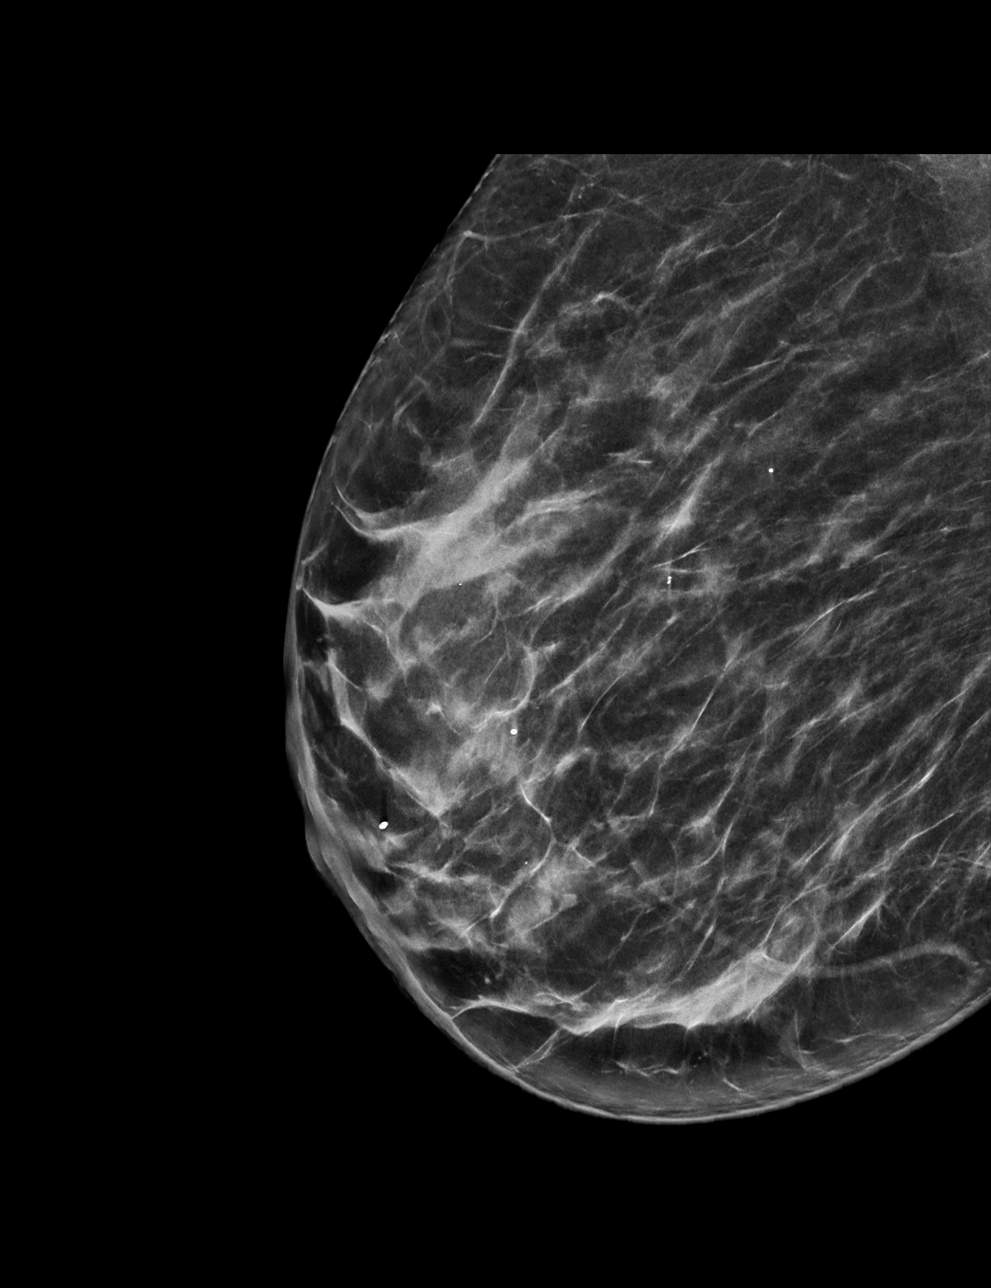

[R MLO synth-2D]
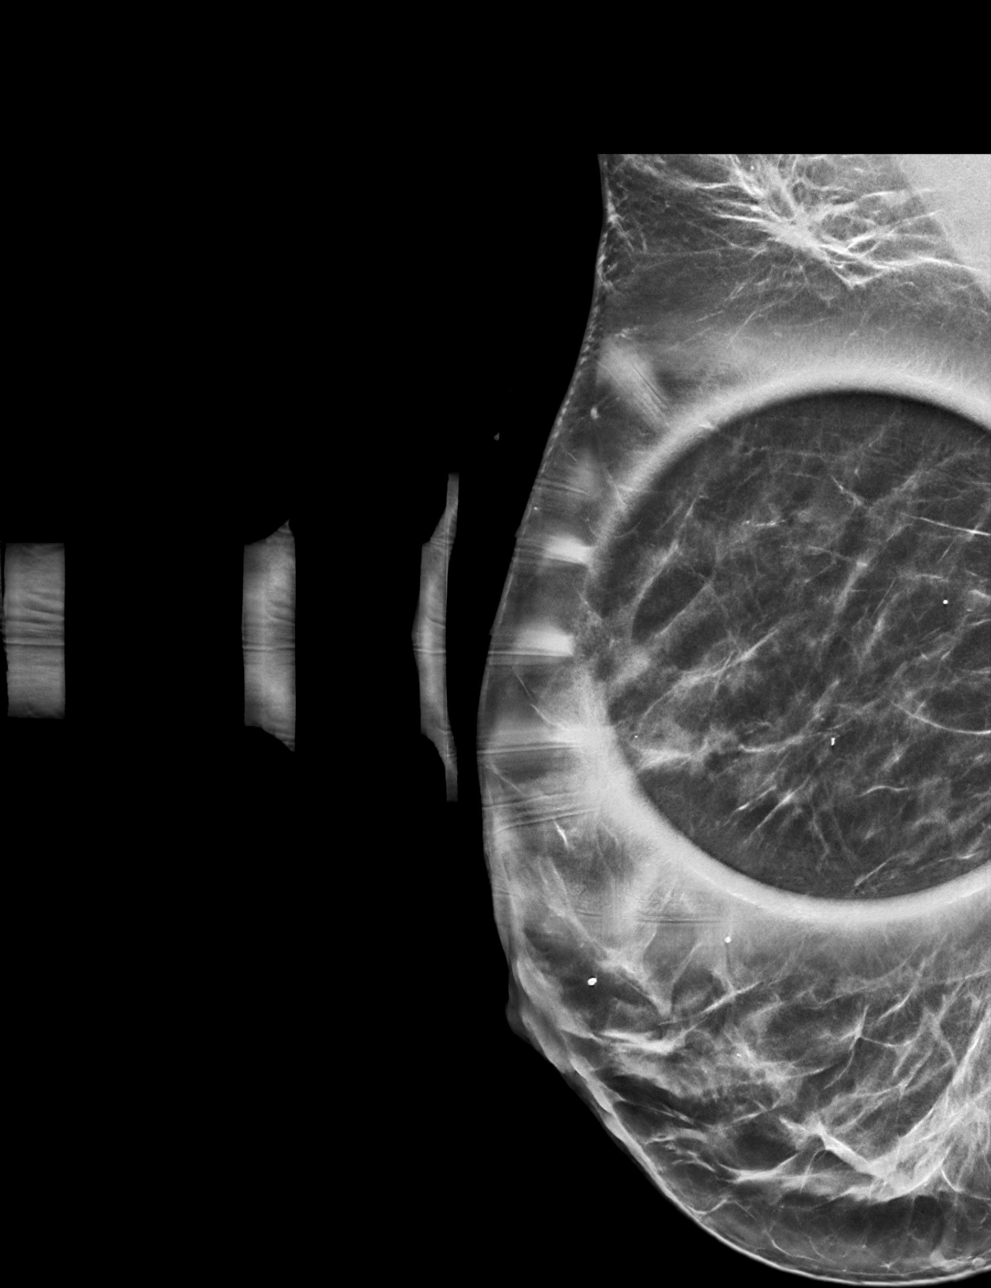

[R MLO tomo · tomo slice 31/62.0]
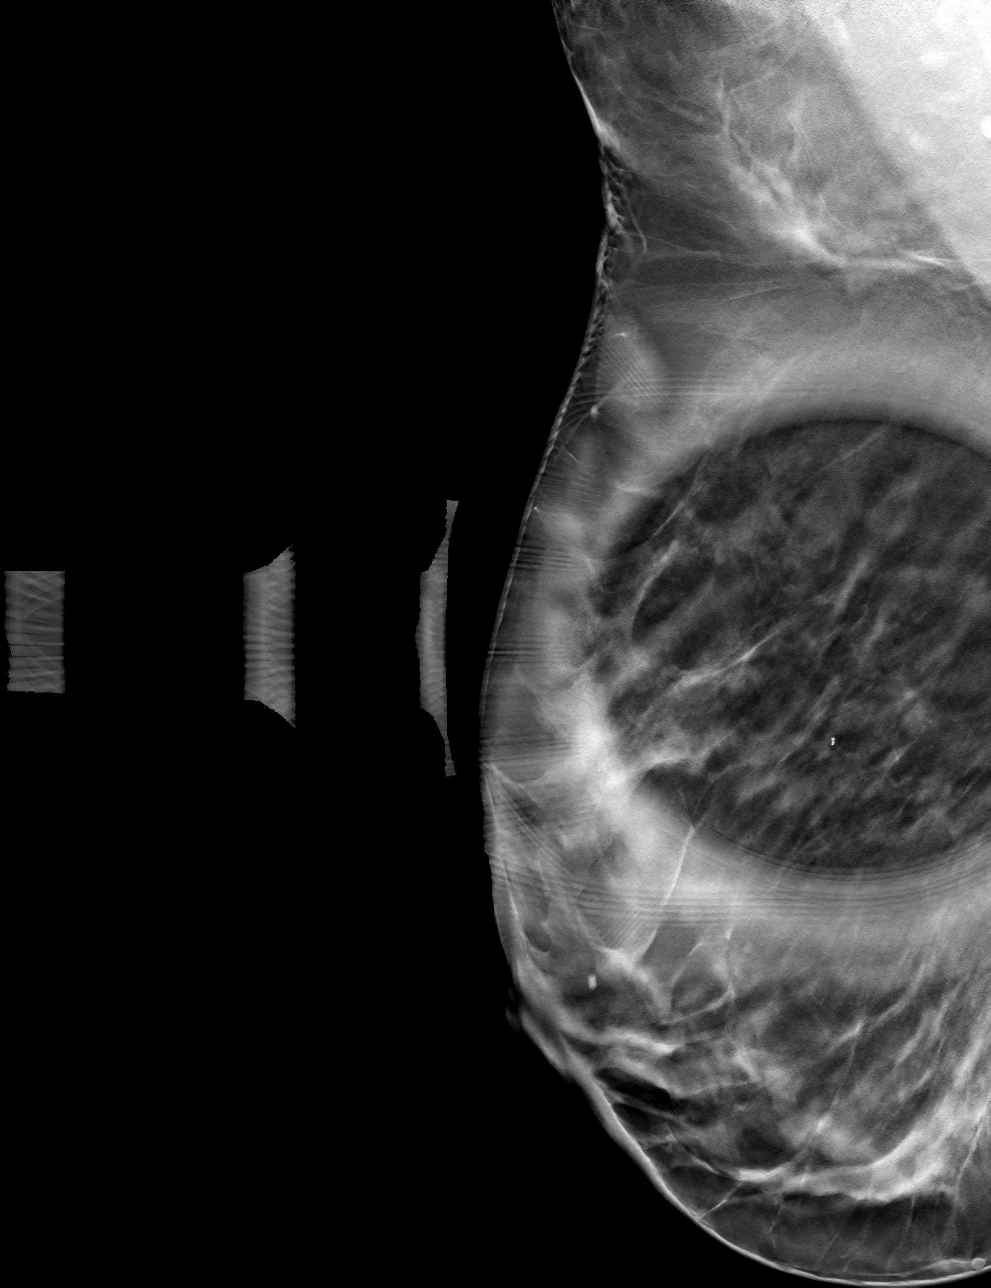

[R ML tomo · tomo slice 33/66.0]
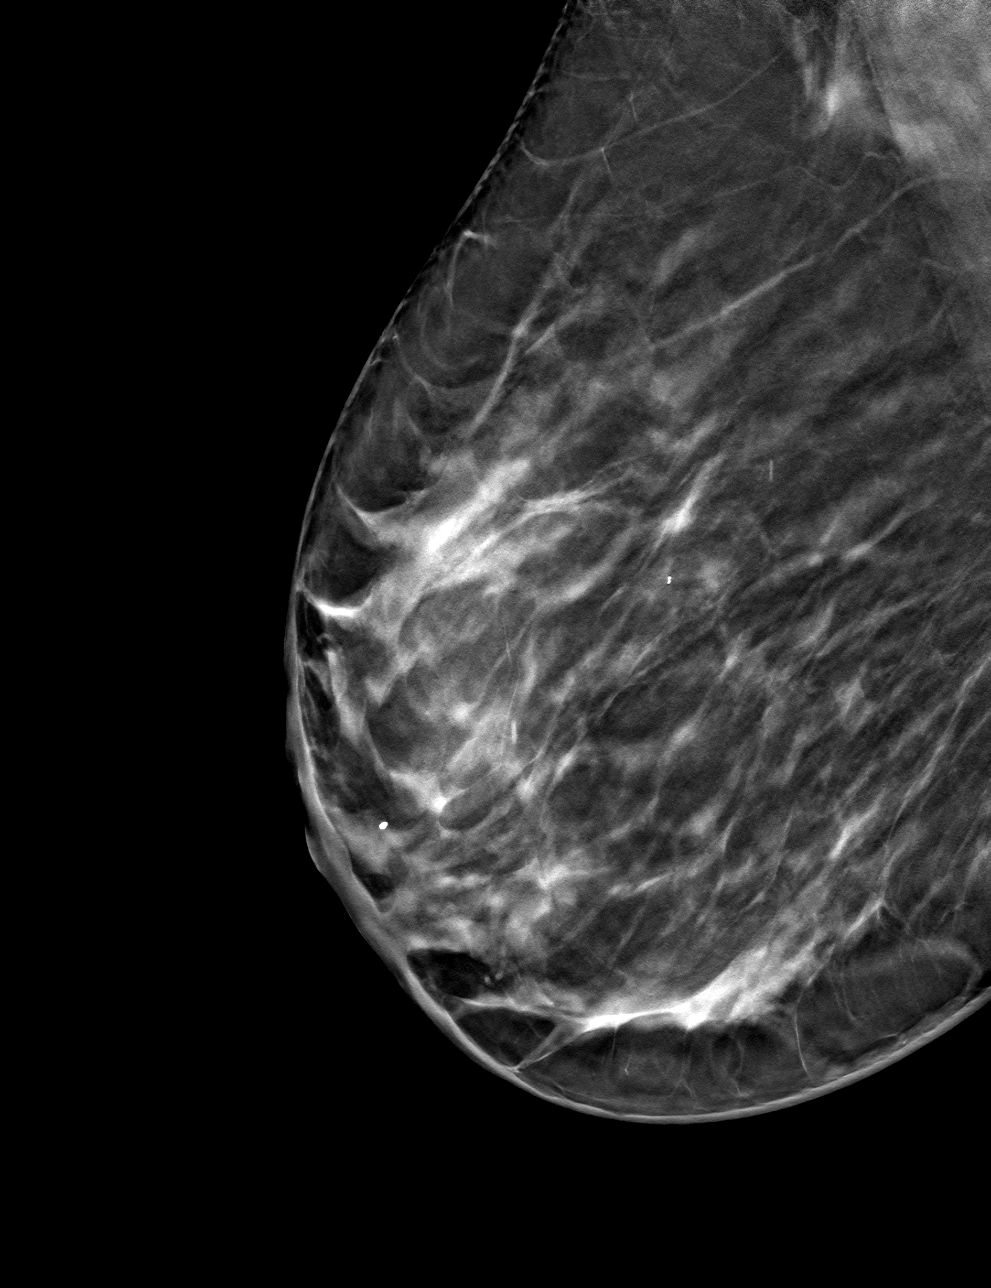

[4 of 12 positions shown; findings below may reference images not displayed]

ACR Breast Density Category c: The breast tissue is heterogeneously
dense, which may obscure small masses.
FINDINGS: Additional tomograms were performed of the right breast. There is an
oval mass with obscured margins in the slightly upper outer right
breast.

Mammographic images were processed with CAD.

Targeted ultrasound of the right breast was performed demonstrating
several benign cysts in the upper-outer quadrant. A cyst at 10
o'clock 8 cm from nipple measures 0.6 x 0.3 x 0.5 cm. Two adjacent
cysts in the right breast at the [DATE] position 7 cm from nipple
measures 0.5 x 0.3 x 0.6 cm and 0.6 x 0.3 x 0.5 cm. These cysts are
felt to correspond well with the mass seen in the upper-outer right
breast at mammography.
IMPRESSION: No findings of malignancy in the right breast.

RECOMMENDATION:
Screening mammogram in one year.(Code:QD-7-1B3)

I have discussed the findings and recommendations with the patient.
If applicable, a reminder letter will be sent to the patient
regarding the next appointment.

BI-RADS CATEGORY  2: Benign.

## 2021-05-20 IMAGING — US US BREAST*R* LIMITED INC AXILLA
1 series · 10 of 10 positions shown · non-contrast
Comparison: Previous exams.

CLINICAL DATA: Screening recall for possible right breast
asymmetry.

EXAM:
DIGITAL DIAGNOSTIC UNILATERAL RIGHT MAMMOGRAM WITH TOMO AND CAD;
ULTRASOUND RIGHT BREAST LIMITED

[Series 1: us breast*right* limited inc axilla · 0.06mm/px · 10 of 10 slices shown]
[im 1/10]
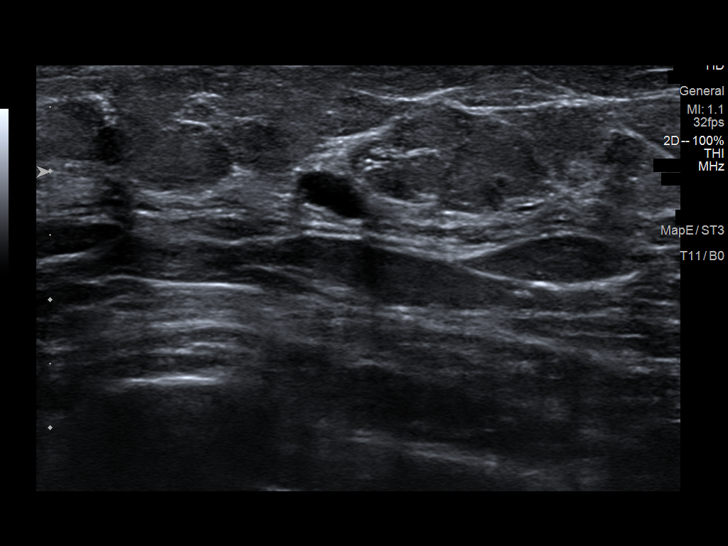
[im 2/10]
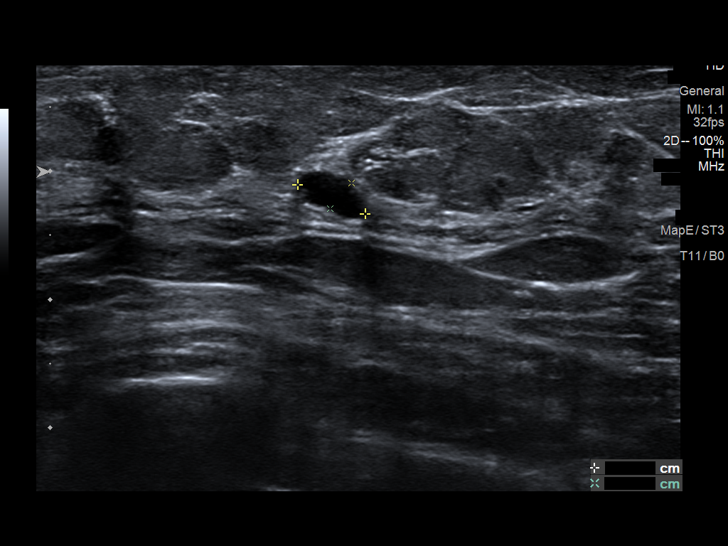
[im 3/10]
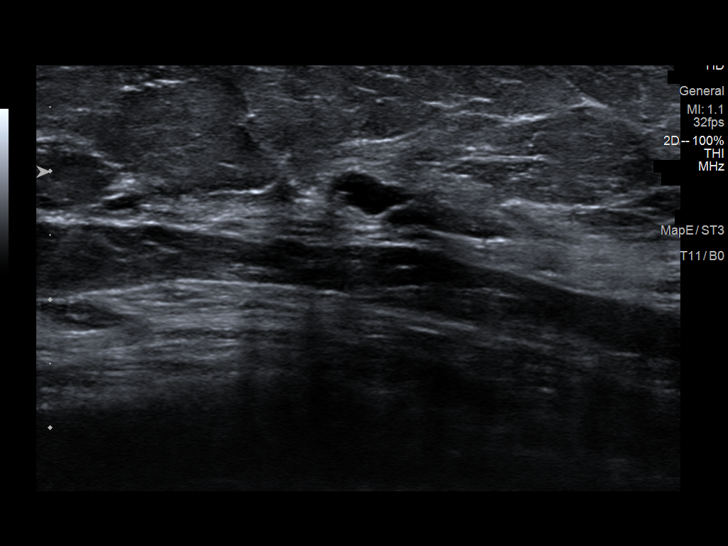
[im 4/10]
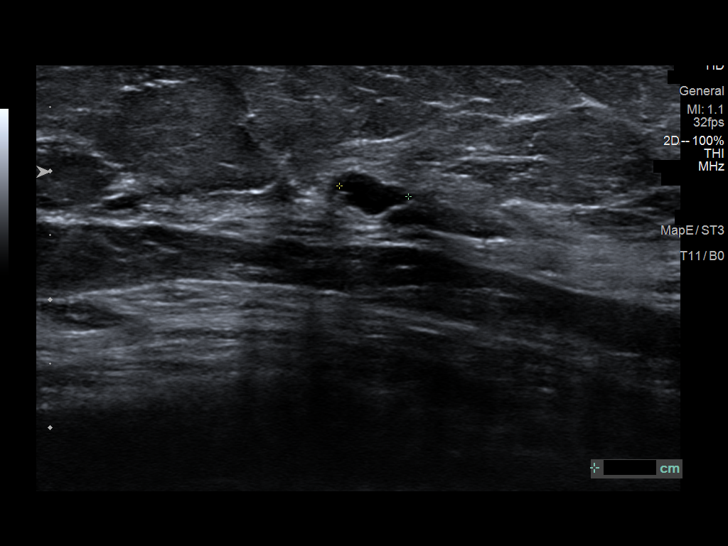
[im 5/10]
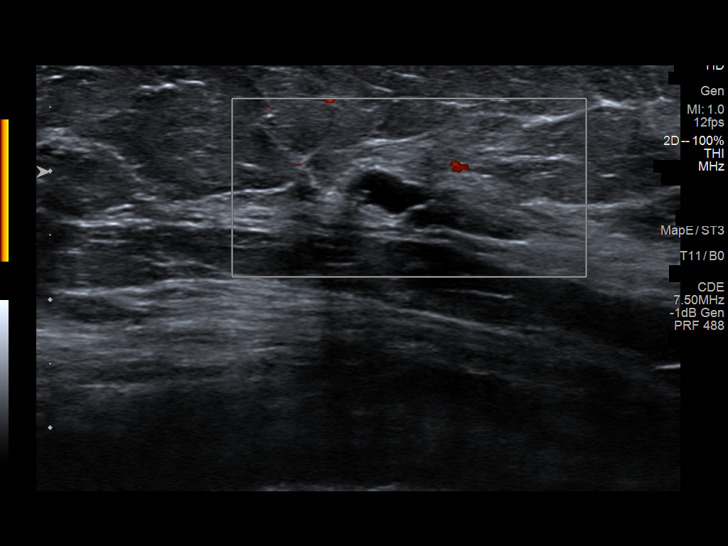
[im 6/10]
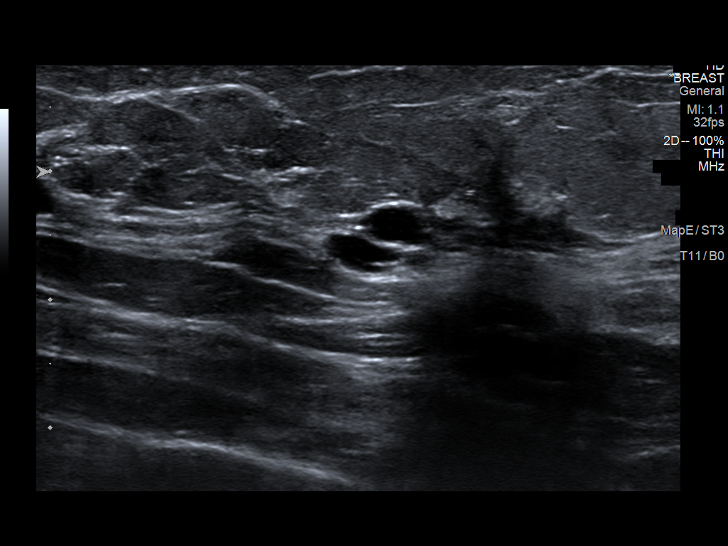
[im 7/10]
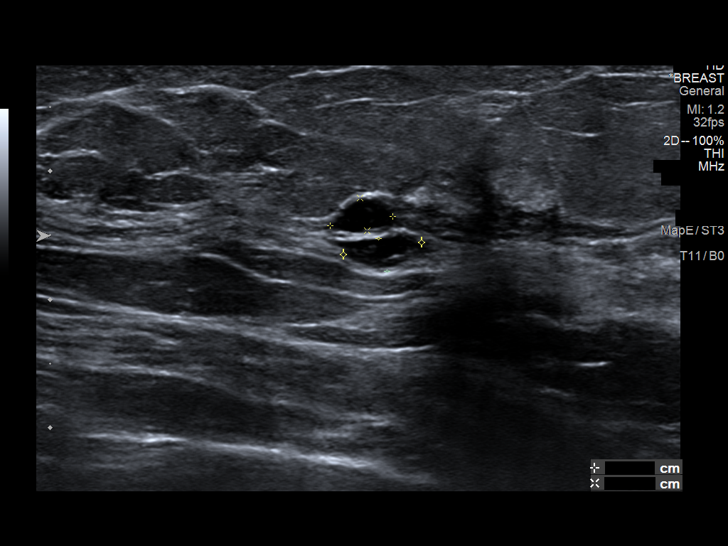
[im 8/10]
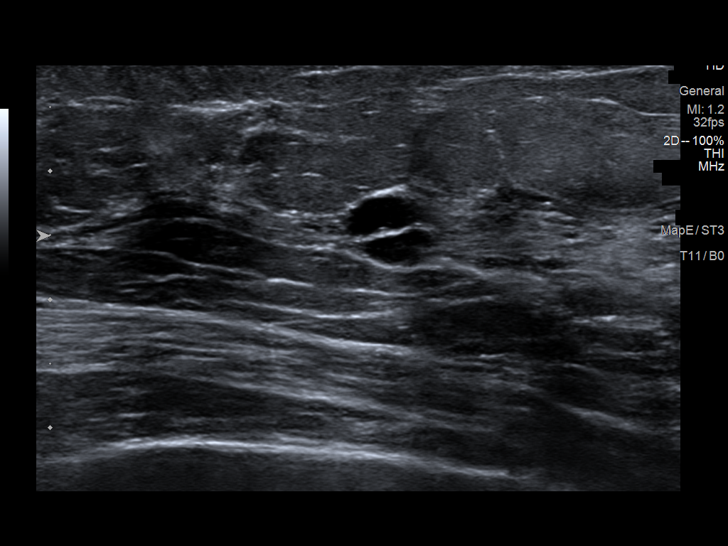
[im 9/10]
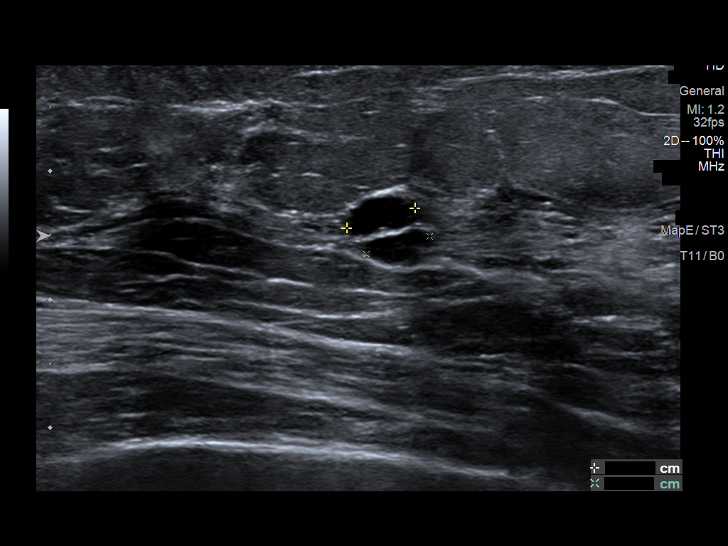
[im 10/10]
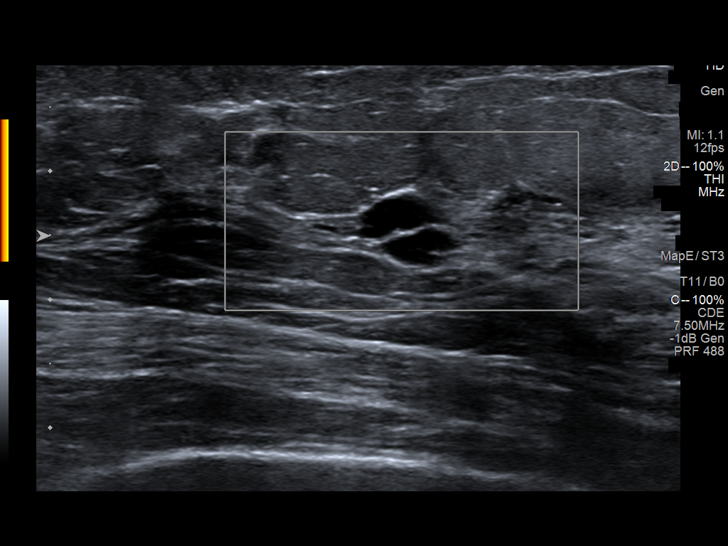

[10 of 10 positions shown; findings below may reference images not displayed]

ACR Breast Density Category c: The breast tissue is heterogeneously
dense, which may obscure small masses.
FINDINGS: Additional tomograms were performed of the right breast. There is an
oval mass with obscured margins in the slightly upper outer right
breast.

Mammographic images were processed with CAD.

Targeted ultrasound of the right breast was performed demonstrating
several benign cysts in the upper-outer quadrant. A cyst at 10
o'clock 8 cm from nipple measures 0.6 x 0.3 x 0.5 cm. Two adjacent
cysts in the right breast at the [DATE] position 7 cm from nipple
measures 0.5 x 0.3 x 0.6 cm and 0.6 x 0.3 x 0.5 cm. These cysts are
felt to correspond well with the mass seen in the upper-outer right
breast at mammography.
IMPRESSION: No findings of malignancy in the right breast.

RECOMMENDATION:
Screening mammogram in one year.(Code:QD-7-1B3)

I have discussed the findings and recommendations with the patient.
If applicable, a reminder letter will be sent to the patient
regarding the next appointment.

BI-RADS CATEGORY  2: Benign.

## 2021-05-26 ENCOUNTER — Other Ambulatory Visit: Payer: Self-pay | Admitting: Obstetrics and Gynecology

## 2021-05-26 DIAGNOSIS — Z1231 Encounter for screening mammogram for malignant neoplasm of breast: Secondary | ICD-10-CM

## 2021-07-02 ENCOUNTER — Ambulatory Visit
Admission: RE | Admit: 2021-07-02 | Discharge: 2021-07-02 | Disposition: A | Discharge status: Home / Self Care | Payer: 59 | Source: Ambulatory Visit | Attending: Obstetrics and Gynecology | Admitting: Obstetrics and Gynecology

## 2021-07-02 DIAGNOSIS — Z1231 Encounter for screening mammogram for malignant neoplasm of breast: Secondary | ICD-10-CM

## 2021-11-24 ENCOUNTER — Ambulatory Visit
Admission: EM | Admit: 2021-11-24 | Discharge: 2021-11-24 | Disposition: A | Discharge status: Home / Self Care | Payer: 59 | Attending: Emergency Medicine | Admitting: Emergency Medicine

## 2021-11-24 ENCOUNTER — Encounter: Payer: Self-pay | Admitting: Emergency Medicine

## 2021-11-24 DIAGNOSIS — J069 Acute upper respiratory infection, unspecified: Secondary | ICD-10-CM

## 2021-11-24 DIAGNOSIS — R030 Elevated blood-pressure reading, without diagnosis of hypertension: Secondary | ICD-10-CM | POA: Diagnosis not present

## 2021-11-24 DIAGNOSIS — Z20822 Contact with and (suspected) exposure to covid-19: Secondary | ICD-10-CM | POA: Diagnosis not present

## 2021-11-24 HISTORY — DX: Hypothyroidism, unspecified: E03.9

## 2021-11-24 HISTORY — DX: Migraine, unspecified, not intractable, without status migrainosus: G43.909

## 2021-11-24 LAB — POCT RAPID STREP A (OFFICE): Rapid Strep A Screen: NEGATIVE

## 2021-11-24 MED ORDER — ONDANSETRON 4 MG PO TBDP: 4.0000 mg | ORAL_TABLET | Freq: Three times a day (TID) | ORAL | 0 refills | Status: AC | PRN

## 2021-11-24 MED ORDER — FLUTICASONE PROPIONATE 50 MCG/ACT NA SUSP: 2.0000 | Freq: Every day | NASAL | 0 refills | Status: AC

## 2021-11-24 MED ORDER — AMOXICILLIN-POT CLAVULANATE 875-125 MG PO TABS: 1.0000 | ORAL_TABLET | Freq: Two times a day (BID) | ORAL | 0 refills | Status: DC

## 2021-11-24 NOTE — ED Triage Notes (Signed)
Patient c/o nasal congestion and nonproductive cough x 3 days.   Patient denies fever at home.   Patient endorses diarrhea and N/V.   Patient endorses dizziness at times.   Patient endorses sneezing at times. Patient endorses wheezing at times.   Patient has taken Sudafed with no relief of symptoms.

## 2021-11-24 NOTE — Discharge Instructions (Addendum)
Discontinue the Claritin, Sudafed.  Start Flonase, Mucinex, saline nasal irrigation with a Neil Med rinse and distilled water as often as you want.  I would wait a day or 2 to start the Augmentin for sinusitis.  Zofran as needed for nausea.  Go immediately to the ED if your vertigo returns.  Keep an eye on your blood pressure.  It was elevated today.  Decrease your salt intake. diet and exercise will lower your blood pressure significantly. It is important to keep your blood pressure under good control, as having a elevated blood pressure for prolonged periods of time significantly increases your risk of stroke, heart attacks, kidney damage, eye damage, and other problems. Measure your blood pressure once a day, preferably at the same time every day. Keep a log of this and bring it to your next doctor's appointment.  Bring your blood pressure cuff as well.  Return here in 2 weeks for blood pressure recheck if you're unable to find a primary care physician by then. Return immediately to the ER if you start having chest pain, headache, problems seeing, problems talking, problems walking, if you feel like you're about to pass out, if you do pass out, if you have a seizure, or for any other concerns.  Go to www.goodrx.com  or www.costplusdrugs.com to look up your medications. This will give you a list of where you can find your prescriptions at the most affordable prices. Or ask the pharmacist what the cash price is, or if they have any other discount programs available to help make your medication more affordable. This can be less expensive than what you would pay with insurance.

## 2021-11-24 NOTE — ED Provider Notes (Incomplete)
HPI  SUBJECTIVE:  Kimberly Zimmerman is a 56 y.o. female who presents with 4 days of nasal congestion, fatigue, headache, clear rhinorrhea, sneezing, wheezing, shortness of breath, scratchy sore throat, postnasal drip.  She tried Claritin, which was shortly followed by vertigo  accompanied with nausea, vomiting and diarrhea that lasted for hours yesterday, but has since resolved.  She now reports residual intermittent, burning, upper abdominal pain that lasts seconds.  There are no aggravating or alleviating factors for this.  She is tolerating p.o.  Denies melena, hematochezia, abdominal distention.  No known COVID exposure.  She had 2 doses of the COVID-vaccine.  She states that she had a negative home COVID test.   The vertigo that she had yesterday was consistent with previous episodes of vertigo.  She tried bending forward and placing a cold cloth on her forehead.  Bending forward helps, symptoms worse with any movement.  She tried Sudafed without improvement in her symptoms.  No aggravating factors.  She reports left-sided tinnitus, but has had this "for a long time".  She denies arm or leg weakness, facial droop, syncope, or other change in medications, worst headache of her life.  She has a past medical history of vertigo, hypothyroidism, asthma, and hypercholesterolemia.  No history of diabetes, hypertension, MI, coronary disease, allergies, stroke, Mnire's, arrhythmia.  [facility].    Past Medical History:  Diagnosis Date   Anxiety    Hypercholesteremia    Hypothyroid    Migraines     Past Surgical History:  Procedure Laterality Date   ABDOMINAL HYSTERECTOMY     Arm surgery     Thyroidectomy      History reviewed. No pertinent family history.  Social History   Tobacco Use   Smoking status: Never   Smokeless tobacco: Never  Substance Use Topics   Alcohol use: Yes   Drug use: No    No current facility-administered medications for this encounter.  Current  Outpatient Medications:    amoxicillin-clavulanate (AUGMENTIN) 875-125 MG tablet, Take 1 tablet by mouth every 12 (twelve) hours., Disp: 14 tablet, Rfl: 0   atorvastatin (LIPITOR) 10 MG tablet, Take 10 mg by mouth every evening. , Disp: , Rfl:    escitalopram (LEXAPRO) 10 MG tablet, Take 2.5-5 mg by mouth See admin instructions. Take 1/4 tablet every morning and take 1/2 tablet every evening, Disp: , Rfl:    fluticasone (FLONASE) 50 MCG/ACT nasal spray, Place 2 sprays into both nostrils daily., Disp: 16 g, Rfl: 0   ondansetron (ZOFRAN-ODT) 4 MG disintegrating tablet, Take 1 tablet (4 mg total) by mouth every 8 (eight) hours as needed for nausea or vomiting., Disp: 20 tablet, Rfl: 0   SYNTHROID 100 MCG tablet, Take 100 mcg by mouth daily before breakfast. , Disp: , Rfl:    albuterol (PROVENTIL HFA;VENTOLIN HFA) 108 (90 Base) MCG/ACT inhaler, Inhale 1-2 puffs into the lungs every 6 (six) hours as needed for wheezing or shortness of breath., Disp: , Rfl:    ALPRAZolam (XANAX) 0.25 MG tablet, Take 0.25 mg by mouth 3 (three) times daily as needed for anxiety., Disp: , Rfl:   Allergies  Allergen Reactions   Codeine Hives   Erythromycin Nausea And Vomiting   Septra [Sulfamethoxazole-Trimethoprim] Nausea And Vomiting   Topamax [Topiramate] Other (See Comments)    "Rectal Pain"     ROS  As noted in HPI.   Physical Exam  BP (!) 158/97   Pulse 89   Temp 98.6 F (37 C)  Resp 18   SpO2 96%   Constitutional: Well developed, well nourished, no acute distress Eyes:  EOMI, conjunctiva normal bilaterally HENT: Normocephalic, atraumatic,mucus membranes moist.  TMs normal bilaterally.  Positive nasal congestion.  Erythematous, swollen turbinates.  Positive maxillary sinus tenderness.  No frontal sinus tenderness.  Slightly erythematous oropharynx, tonsils normal size without exudates.  Uvula midline. Neck: Positive cervical lymphadenopathy Respiratory: Normal inspiratory effort, lungs clear  bilaterally. Cardiovascular: Normal rate, regular rhythm, no murmurs rubs or gallop.  Cap refill less than 2 seconds. GI: nondistended soft, nontender, no guarding, rebound, active bowel sounds. skin: No rash, skin intact Musculoskeletal: no deformities Neurologic: Alert & oriented x 3, cranial nerves III through XII grossly intact, finger-nose, heel shin within normal limits, tandem gait steady.  Romberg negative. Psychiatric: Speech and behavior appropriate   ED Course   Medications - No data to display  Orders Placed This Encounter  Procedures   Novel Coronavirus, NAA ([facility])    Standing Status:   Standing    Number of Occurrences:   1   POCT rapid strep A    Standing Status:   Standing    Number of Occurrences:   1    Results for orders placed or performed during the hospital encounter of 11/24/21 (from the past 24 hour(s))  POCT rapid strep A     Status: None   Collection Time: 11/24/21  5:03 PM  Result Value Ref Range   Rapid Strep A Screen Negative Negative   No results found.  ED Clinical Impression  1. Acute upper respiratory infection   2. Encounter for laboratory testing for COVID-19 virus   3. Elevated blood pressure reading in office without diagnosis of hypertension      ED Assessment/Plan  Initial blood pressure noted.  She was normotensive on her last visit at her PCP in February 125/74.  She currently has no complaints.  Her repeat blood pressure is 158/97.  She has a blood pressure cuff at home.  will have her keep a log of her blood pressure and follow-up with her PCP.  ER return precautions given.  Strep is negative, do not think that she has a strep pharyngitis.  We will not send off for culture.  Patient with an upper respiratory infection/maxillary sinusitis.  Will check COVID, although discussed with her that she will be out of the treatment window by the time it is resulted.  Her vertigo has completely resolved, she is tolerating p.o. today.   Her abdomen is benign.  Vertigo could have been from eustachian tube dysfunction, medication side effect.  Posterior cerebellar TIA in the differential, but currently, she is not vertiginous.  Advised her to go to the ED if it returns.  In the meantime, we will treat her sinusitis with Mucinex, Flonase, saline nasal irrigation.  Discontinue the antihistamine and Sudafed as this could be contributing to her elevated blood pressure.  Wait-and-see prescription of Augmentin.  Zofran.  COVID pending at the time of initial signing of this note  Discussed  MDM, treatment plan, and plan for follow-up with patient. Discussed sn/sx that should prompt return to the ED. patient agrees with plan.   Meds ordered this encounter  Medications   fluticasone (FLONASE) 50 MCG/ACT nasal spray    Sig: Place 2 sprays into both nostrils daily.    Dispense:  16 g    Refill:  0   amoxicillin-clavulanate (AUGMENTIN) 875-125 MG tablet    Sig: Take 1 tablet by mouth every 12 (  twelve) hours.    Dispense:  14 tablet    Refill:  0   ondansetron (ZOFRAN-ODT) 4 MG disintegrating tablet    Sig: Take 1 tablet (4 mg total) by mouth every 8 (eight) hours as needed for nausea or vomiting.    Dispense:  20 tablet    Refill:  0      *This clinic note was created using [employer]. Therefore, there may be occasional mistakes despite careful proofreading.  ?    Melynda Ripple, MD 11/25/21 2589    Melynda Ripple, MD 11/25/21 [phone]

## 2021-11-25 LAB — NOVEL CORONAVIRUS, NAA: SARS-CoV-2, NAA: NOT DETECTED

## 2022-05-30 ENCOUNTER — Other Ambulatory Visit: Payer: Self-pay | Admitting: Family Medicine

## 2022-05-30 DIAGNOSIS — Z1231 Encounter for screening mammogram for malignant neoplasm of breast: Secondary | ICD-10-CM

## 2022-06-23 ENCOUNTER — Other Ambulatory Visit: Payer: Self-pay | Admitting: Family Medicine

## 2022-06-23 DIAGNOSIS — N63 Unspecified lump in unspecified breast: Secondary | ICD-10-CM

## 2022-08-15 ENCOUNTER — Ambulatory Visit
Admission: RE | Admit: 2022-08-15 | Discharge: 2022-08-15 | Disposition: A | Discharge status: Home / Self Care | Payer: 59 | Source: Ambulatory Visit | Attending: Family Medicine | Admitting: Family Medicine

## 2022-08-15 DIAGNOSIS — N63 Unspecified lump in unspecified breast: Secondary | ICD-10-CM

## 2023-08-01 ENCOUNTER — Other Ambulatory Visit: Payer: Self-pay | Admitting: Obstetrics and Gynecology

## 2023-08-01 DIAGNOSIS — Z1231 Encounter for screening mammogram for malignant neoplasm of breast: Secondary | ICD-10-CM

## 2023-08-08 DIAGNOSIS — F419 Anxiety disorder, unspecified: Secondary | ICD-10-CM | POA: Diagnosis not present

## 2023-08-08 DIAGNOSIS — F339 Major depressive disorder, recurrent, unspecified: Secondary | ICD-10-CM | POA: Diagnosis not present

## 2023-08-08 DIAGNOSIS — E785 Hyperlipidemia, unspecified: Secondary | ICD-10-CM | POA: Diagnosis not present

## 2023-08-08 DIAGNOSIS — R7303 Prediabetes: Secondary | ICD-10-CM | POA: Diagnosis not present

## 2023-08-08 DIAGNOSIS — Z0000 Encounter for general adult medical examination without abnormal findings: Secondary | ICD-10-CM | POA: Diagnosis not present

## 2023-08-08 DIAGNOSIS — E039 Hypothyroidism, unspecified: Secondary | ICD-10-CM | POA: Diagnosis not present

## 2023-08-22 ENCOUNTER — Ambulatory Visit
Admission: RE | Admit: 2023-08-22 | Discharge: 2023-08-22 | Disposition: A | Discharge status: Home / Self Care | Payer: 59 | Source: Ambulatory Visit | Attending: Obstetrics and Gynecology | Admitting: Obstetrics and Gynecology

## 2023-08-22 DIAGNOSIS — Z1231 Encounter for screening mammogram for malignant neoplasm of breast: Secondary | ICD-10-CM

## 2023-08-23 DIAGNOSIS — D2261 Melanocytic nevi of right upper limb, including shoulder: Secondary | ICD-10-CM | POA: Diagnosis not present

## 2023-08-23 DIAGNOSIS — D225 Melanocytic nevi of trunk: Secondary | ICD-10-CM | POA: Diagnosis not present

## 2023-08-23 DIAGNOSIS — D2271 Melanocytic nevi of right lower limb, including hip: Secondary | ICD-10-CM | POA: Diagnosis not present

## 2023-08-23 DIAGNOSIS — L821 Other seborrheic keratosis: Secondary | ICD-10-CM | POA: Diagnosis not present

## 2023-09-08 ENCOUNTER — Ambulatory Visit
Admission: RE | Admit: 2023-09-08 | Discharge: 2023-09-08 | Disposition: A | Discharge status: Home / Self Care | Source: Ambulatory Visit | Attending: Emergency Medicine | Admitting: Emergency Medicine

## 2023-09-08 VITALS — HR 81 | Temp 99.3°F | Resp 18

## 2023-09-08 DIAGNOSIS — H6691 Otitis media, unspecified, right ear: Secondary | ICD-10-CM | POA: Diagnosis not present

## 2023-09-08 DIAGNOSIS — U071 COVID-19: Secondary | ICD-10-CM

## 2023-09-08 MED ORDER — AMOXICILLIN 875 MG PO TABS: 875.0000 mg | ORAL_TABLET | Freq: Two times a day (BID) | ORAL | 0 refills | Status: AC

## 2023-09-08 NOTE — ED Provider Notes (Signed)
 Renaldo Fiddler    CSN: [id number] Arrival date & time: 09/08/23  1059      History   Chief Complaint Chief Complaint  Patient presents with   Ear Fullness    i tested positive for Covid this morning, but having pain in my ear quite a bit. - Entered by patient    HPI Kimberly Zimmerman is a 58 y.o. female.  Patient presents with right ear pain, congestion, runny nose, cough since yesterday.  She tested positive for COVID at home today.  She reports feeling fatigued and scratchy throat x 4 days.  No fever, shortness of breath, vomiting, diarrhea.  No OTC medications taken today.  The history is provided by the patient and medical records.    Past Medical History:  Diagnosis Date   Anxiety    Hypercholesteremia    Hypothyroid    Migraines     There are no active problems to display for this patient.   Past Surgical History:  Procedure Laterality Date   ABDOMINAL HYSTERECTOMY     Arm surgery     Thyroidectomy      OB History   No obstetric history on file.      Home Medications    Prior to Admission medications   Medication Sig Start Date End Date Taking? Authorizing Provider  amoxicillin (AMOXIL) 875 MG tablet Take 1 tablet (875 mg total) by mouth 2 (two) times daily for 10 days. 09/08/23 09/18/23 Yes Mickie Bail, NP  albuterol (PROVENTIL HFA;VENTOLIN HFA) 108 (90 Base) MCG/ACT inhaler Inhale 1-2 puffs into the lungs every 6 (six) hours as needed for wheezing or shortness of breath.    [provider]  ALPRAZolam Prudy Feeler) 0.25 MG tablet Take 0.25 mg by mouth 3 (three) times daily as needed for anxiety.    [provider]  atorvastatin (LIPITOR) 10 MG tablet Take 10 mg by mouth every evening.  06/24/15   [provider]  escitalopram (LEXAPRO) 10 MG tablet Take 2.5-5 mg by mouth See admin instructions. Take 1/4 tablet every morning and take 1/2 tablet every evening 06/24/15   [provider]  fluticasone (FLONASE) 50 MCG/ACT  nasal spray Place 2 sprays into both nostrils daily. 11/24/21   Domenick Gong, MD  ondansetron (ZOFRAN-ODT) 4 MG disintegrating tablet Take 1 tablet (4 mg total) by mouth every 8 (eight) hours as needed for nausea or vomiting. 11/24/21   Domenick Gong, MD  SYNTHROID 100 MCG tablet Take 100 mcg by mouth daily before breakfast.  05/22/15   [provider]    Family History Family History  Problem Relation Age of Onset   Lung cancer Maternal Grandmother    BRCA 1/2 Neg Hx    Breast cancer Neg Hx     Social History Social History   Tobacco Use   Smoking status: Never   Smokeless tobacco: Never  Substance Use Topics   Alcohol use: Yes   Drug use: No     Allergies   Codeine, Erythromycin, Septra [sulfamethoxazole-trimethoprim], and Topamax [topiramate]   Review of Systems Review of Systems  Constitutional:  Negative for chills and fever.  HENT:  Positive for congestion, ear pain, rhinorrhea and sore throat.   Respiratory:  Positive for cough. Negative for shortness of breath.   Gastrointestinal:  Negative for diarrhea and vomiting.     Physical Exam Triage Vital Signs ED Triage Vitals  Encounter Vitals Group     BP --      Systolic BP Percentile --  Diastolic BP Percentile --      Pulse Rate 09/08/23 1122 81     Resp 09/08/23 1122 18     Temp 09/08/23 1122 99.3 F (37.4 C)     Temp src --      SpO2 09/08/23 1122 98 %     Weight --      Height --      Head Circumference --      Peak Flow --      Pain Score 09/08/23 1127 4     Pain Loc --      Pain Education --      Exclude from Growth Chart --    No data found.  Updated Vital Signs Pulse 81   Temp 99.3 F (37.4 C)   Resp 18   SpO2 98%   Visual Acuity Right Eye Distance:   Left Eye Distance:   Bilateral Distance:    Right Eye Near:   Left Eye Near:    Bilateral Near:     Physical Exam Constitutional:      General: She is not in acute distress. HENT:     Right Ear: Tympanic  membrane is erythematous.     Left Ear: Tympanic membrane normal.     Nose: Congestion and rhinorrhea present.     Mouth/Throat:     Mouth: Mucous membranes are moist.     Pharynx: Oropharynx is clear.  Cardiovascular:     Rate and Rhythm: Normal rate and regular rhythm.     Heart sounds: Normal heart sounds.  Pulmonary:     Effort: Pulmonary effort is normal. No respiratory distress.     Breath sounds: Normal breath sounds.  Neurological:     Mental Status: She is alert.      UC Treatments / Results  Labs (all labs ordered are listed, but only abnormal results are displayed) Labs Reviewed - No data to display  EKG   Radiology No results found.  Procedures Procedures (including critical care time)  Medications Ordered in UC Medications - No data to display  Initial Impression / Assessment and Plan / UC Course  I have reviewed the triage vital signs and the nursing notes.  Pertinent labs & imaging results that were available during my care of the patient were reviewed by me and considered in my medical decision making (see chart for details).    Right otitis media, COVID-19.  Patient tested positive for COVID this morning at home.  She is not interested in COVID medication.  Treating her ear infection with amoxicillin.  Discussed symptomatic treatment including Tylenol or ibuprofen as needed for fever or discomfort, plain Mucinex as needed for congestion, rest, hydration.  Instructed patient to follow-up with PCP if not improving.  ED precautions given.  Patient agrees to plan of care.    Final Clinical Impressions(s) / UC Diagnoses   Final diagnoses:  Right otitis media, unspecified otitis media type  COVID-19     Discharge Instructions      Take the amoxicillin as directed for your ear infection.    Take Tylenol or ibuprofen as needed for fever or discomfort.  Take plain Mucinex as needed for congestion.  Rest and keep yourself hydrated.    Follow-up with  your primary care provider if your symptoms are not improving.         ED Prescriptions     Medication Sig Dispense Auth. Provider   amoxicillin (AMOXIL) 875 MG tablet Take 1 tablet (875 mg  total) by mouth 2 (two) times daily for 10 days. 20 tablet Mickie Bail, NP      PDMP not reviewed this encounter.   Mickie Bail, NP 09/08/23 [phone]

## 2023-09-08 NOTE — ED Triage Notes (Signed)
 Patient to Urgent Care with complaints of right sided ear pain denies any drainage.  Symptoms started yesterday. Covid positive this morning via home test.   No otc meds today- taking Mucinex DM.

## 2023-09-08 NOTE — Discharge Instructions (Addendum)
 Take the amoxicillin as directed for your ear infection.    Take Tylenol or ibuprofen as needed for fever or discomfort.  Take plain Mucinex as needed for congestion.  Rest and keep yourself hydrated.    Follow-up with your primary care provider if your symptoms are not improving.

## 2023-09-11 DIAGNOSIS — J392 Other diseases of pharynx: Secondary | ICD-10-CM | POA: Diagnosis not present

## 2023-09-11 DIAGNOSIS — H6123 Impacted cerumen, bilateral: Secondary | ICD-10-CM | POA: Diagnosis not present

## 2023-11-29 DIAGNOSIS — Z01419 Encounter for gynecological examination (general) (routine) without abnormal findings: Secondary | ICD-10-CM | POA: Diagnosis not present

## 2023-11-29 DIAGNOSIS — Z6826 Body mass index (BMI) 26.0-26.9, adult: Secondary | ICD-10-CM | POA: Diagnosis not present

## 2024-02-22 DIAGNOSIS — F419 Anxiety disorder, unspecified: Secondary | ICD-10-CM | POA: Diagnosis not present

## 2024-02-22 DIAGNOSIS — E039 Hypothyroidism, unspecified: Secondary | ICD-10-CM | POA: Diagnosis not present

## 2024-02-22 DIAGNOSIS — F339 Major depressive disorder, recurrent, unspecified: Secondary | ICD-10-CM | POA: Diagnosis not present

## 2024-02-22 DIAGNOSIS — E785 Hyperlipidemia, unspecified: Secondary | ICD-10-CM | POA: Diagnosis not present

## 2024-05-22 DIAGNOSIS — H10023 Other mucopurulent conjunctivitis, bilateral: Secondary | ICD-10-CM | POA: Diagnosis not present

## 2024-05-27 DIAGNOSIS — H10023 Other mucopurulent conjunctivitis, bilateral: Secondary | ICD-10-CM | POA: Diagnosis not present

## 2024-05-27 DIAGNOSIS — H40053 Ocular hypertension, bilateral: Secondary | ICD-10-CM | POA: Diagnosis not present

## 2024-06-10 DIAGNOSIS — M79602 Pain in left arm: Secondary | ICD-10-CM | POA: Diagnosis not present

## 2024-07-09 ENCOUNTER — Other Ambulatory Visit: Payer: Self-pay | Admitting: Obstetrics and Gynecology

## 2024-07-09 DIAGNOSIS — Z1231 Encounter for screening mammogram for malignant neoplasm of breast: Secondary | ICD-10-CM

## 2024-07-12 ENCOUNTER — Other Ambulatory Visit: Payer: Self-pay | Admitting: Family Medicine

## 2024-07-12 DIAGNOSIS — R928 Other abnormal and inconclusive findings on diagnostic imaging of breast: Secondary | ICD-10-CM

## 2024-07-12 DIAGNOSIS — N63 Unspecified lump in unspecified breast: Secondary | ICD-10-CM

## 2024-07-16 ENCOUNTER — Other Ambulatory Visit: Payer: Self-pay | Admitting: Family Medicine

## 2024-07-16 DIAGNOSIS — N63 Unspecified lump in unspecified breast: Secondary | ICD-10-CM

## 2024-07-17 ENCOUNTER — Inpatient Hospital Stay: Admission: RE | Admit: 2024-07-17 | Discharge: 2024-07-17 | Attending: Family Medicine

## 2024-07-17 ENCOUNTER — Other Ambulatory Visit: Payer: Self-pay | Admitting: Family Medicine

## 2024-07-17 DIAGNOSIS — N63 Unspecified lump in unspecified breast: Secondary | ICD-10-CM

## 2024-07-25 ENCOUNTER — Other Ambulatory Visit

## 2024-07-25 ENCOUNTER — Encounter
